# Patient Record
Sex: Male | Born: 1935 | Race: White | Hispanic: No | Smoking: Never smoker
Health system: Southern US, Community
[De-identification: ages and names within clinical notes are randomized; demographics above are authoritative.]

## PROBLEM LIST (undated history)

## (undated) DIAGNOSIS — D369 Benign neoplasm, unspecified site: Secondary | ICD-10-CM

## (undated) DIAGNOSIS — C61 Malignant neoplasm of prostate: Secondary | ICD-10-CM

## (undated) DIAGNOSIS — R7401 Elevation of levels of liver transaminase levels: Secondary | ICD-10-CM

## (undated) DIAGNOSIS — T7840XA Allergy, unspecified, initial encounter: Secondary | ICD-10-CM

## (undated) DIAGNOSIS — M1990 Unspecified osteoarthritis, unspecified site: Secondary | ICD-10-CM

## (undated) DIAGNOSIS — K222 Esophageal obstruction: Secondary | ICD-10-CM

## (undated) DIAGNOSIS — F5104 Psychophysiologic insomnia: Secondary | ICD-10-CM

## (undated) DIAGNOSIS — N529 Male erectile dysfunction, unspecified: Secondary | ICD-10-CM

## (undated) DIAGNOSIS — J310 Chronic rhinitis: Secondary | ICD-10-CM

## (undated) DIAGNOSIS — R740 Nonspecific elevation of levels of transaminase and lactic acid dehydrogenase [LDH]: Secondary | ICD-10-CM

## (undated) DIAGNOSIS — R011 Cardiac murmur, unspecified: Secondary | ICD-10-CM

## (undated) DIAGNOSIS — E785 Hyperlipidemia, unspecified: Secondary | ICD-10-CM

## (undated) DIAGNOSIS — N400 Enlarged prostate without lower urinary tract symptoms: Secondary | ICD-10-CM

## (undated) HISTORY — DX: Benign neoplasm, unspecified site: D36.9

## (undated) HISTORY — PX: HEMORRHOID SURGERY: SHX153

## (undated) HISTORY — DX: Cardiac murmur, unspecified: R01.1

## (undated) HISTORY — DX: Benign prostatic hyperplasia without lower urinary tract symptoms: N40.0

## (undated) HISTORY — PX: TONSILLECTOMY AND ADENOIDECTOMY: SUR1326

## (undated) HISTORY — PX: NASAL SINUS SURGERY: SHX719

## (undated) HISTORY — PX: POLYPECTOMY: SHX149

## (undated) HISTORY — DX: Chronic rhinitis: J31.0

## (undated) HISTORY — DX: Nonspecific elevation of levels of transaminase and lactic acid dehydrogenase (ldh): R74.0

## (undated) HISTORY — DX: Malignant neoplasm of prostate: C61

## (undated) HISTORY — DX: Psychophysiologic insomnia: F51.04

## (undated) HISTORY — PX: COLONOSCOPY: SHX174

## (undated) HISTORY — PX: DERMOID CYST  EXCISION: SHX1452

## (undated) HISTORY — DX: Elevation of levels of liver transaminase levels: R74.01

## (undated) HISTORY — DX: Esophageal obstruction: K22.2

## (undated) HISTORY — DX: Male erectile dysfunction, unspecified: N52.9

## (undated) HISTORY — DX: Hyperlipidemia, unspecified: E78.5

## (undated) HISTORY — PX: CATARACT EXTRACTION: SUR2

## (undated) HISTORY — DX: Allergy, unspecified, initial encounter: T78.40XA

---

## 1997-04-13 HISTORY — PX: RADIOACTIVE SEED IMPLANT: SHX5150

## 1999-04-16 ENCOUNTER — Other Ambulatory Visit: Admission: RE | Admit: 1999-04-16 | Discharge: 1999-04-16 | Payer: Self-pay | Admitting: Urology

## 1999-05-08 ENCOUNTER — Encounter: Admission: RE | Admit: 1999-05-08 | Discharge: 1999-08-06 | Payer: Self-pay | Admitting: Radiation Oncology

## 1999-07-08 ENCOUNTER — Encounter: Payer: Self-pay | Admitting: Urology

## 1999-07-08 ENCOUNTER — Ambulatory Visit (HOSPITAL_BASED_OUTPATIENT_CLINIC_OR_DEPARTMENT_OTHER): Admission: RE | Admit: 1999-07-08 | Discharge: 1999-07-09 | Payer: Self-pay | Admitting: Urology

## 1999-07-29 ENCOUNTER — Encounter: Payer: Self-pay | Admitting: Radiation Oncology

## 2001-02-28 ENCOUNTER — Encounter (INDEPENDENT_AMBULATORY_CARE_PROVIDER_SITE_OTHER): Payer: Self-pay | Admitting: *Deleted

## 2001-03-13 DIAGNOSIS — D369 Benign neoplasm, unspecified site: Secondary | ICD-10-CM

## 2001-03-13 HISTORY — DX: Benign neoplasm, unspecified site: D36.9

## 2001-03-25 ENCOUNTER — Encounter (INDEPENDENT_AMBULATORY_CARE_PROVIDER_SITE_OTHER): Payer: Self-pay | Admitting: *Deleted

## 2002-10-17 ENCOUNTER — Encounter (INDEPENDENT_AMBULATORY_CARE_PROVIDER_SITE_OTHER): Payer: Self-pay | Admitting: *Deleted

## 2006-01-19 ENCOUNTER — Ambulatory Visit: Payer: Self-pay | Admitting: Gastroenterology

## 2006-02-01 ENCOUNTER — Ambulatory Visit: Payer: Self-pay | Admitting: Gastroenterology

## 2006-02-01 ENCOUNTER — Encounter (INDEPENDENT_AMBULATORY_CARE_PROVIDER_SITE_OTHER): Payer: Self-pay | Admitting: *Deleted

## 2009-05-16 ENCOUNTER — Encounter: Payer: Self-pay | Admitting: Internal Medicine

## 2009-05-16 ENCOUNTER — Ambulatory Visit: Payer: Self-pay | Admitting: Internal Medicine

## 2009-05-16 DIAGNOSIS — R0989 Other specified symptoms and signs involving the circulatory and respiratory systems: Secondary | ICD-10-CM | POA: Insufficient documentation

## 2009-05-16 DIAGNOSIS — C61 Malignant neoplasm of prostate: Secondary | ICD-10-CM

## 2009-05-16 DIAGNOSIS — R0609 Other forms of dyspnea: Secondary | ICD-10-CM

## 2009-05-16 DIAGNOSIS — J310 Chronic rhinitis: Secondary | ICD-10-CM | POA: Insufficient documentation

## 2009-05-17 ENCOUNTER — Ambulatory Visit: Payer: Self-pay | Admitting: Internal Medicine

## 2009-05-17 LAB — CONVERTED CEMR LAB
Basophils Absolute: 0 10*3/uL (ref 0.0–0.1)
Eosinophils Absolute: 0.1 10*3/uL (ref 0.0–0.7)
HCT: 43 % (ref 39.0–52.0)
Hemoglobin: 14.3 g/dL (ref 13.0–17.0)
Lymphocytes Relative: 19.9 % (ref 12.0–46.0)
MCV: 95.2 fL (ref 78.0–100.0)
Monocytes Relative: 9.6 % (ref 3.0–12.0)
Neutrophils Relative %: 68.1 % (ref 43.0–77.0)
RDW: 12.1 % (ref 11.5–14.6)
Sed Rate: 10 mm/hr (ref 0–22)
TSH: 1.85 microintl units/mL (ref 0.35–5.50)
WBC: 5.5 10*3/uL (ref 4.5–10.5)

## 2009-05-20 ENCOUNTER — Ambulatory Visit: Payer: Self-pay | Admitting: Internal Medicine

## 2009-07-02 ENCOUNTER — Ambulatory Visit: Payer: Self-pay | Admitting: Internal Medicine

## 2009-07-19 ENCOUNTER — Telehealth (INDEPENDENT_AMBULATORY_CARE_PROVIDER_SITE_OTHER): Payer: Self-pay | Admitting: *Deleted

## 2010-04-08 ENCOUNTER — Encounter
Admission: RE | Admit: 2010-04-08 | Discharge: 2010-04-08 | Payer: Self-pay | Source: Home / Self Care | Attending: Internal Medicine | Admitting: Internal Medicine

## 2010-04-08 ENCOUNTER — Encounter (INDEPENDENT_AMBULATORY_CARE_PROVIDER_SITE_OTHER): Payer: Self-pay | Admitting: *Deleted

## 2010-04-17 ENCOUNTER — Encounter: Payer: Self-pay | Admitting: Gastroenterology

## 2010-04-21 ENCOUNTER — Ambulatory Visit
Admission: RE | Admit: 2010-04-21 | Discharge: 2010-04-21 | Payer: Self-pay | Source: Home / Self Care | Attending: Gastroenterology | Admitting: Gastroenterology

## 2010-04-21 ENCOUNTER — Encounter: Payer: Self-pay | Admitting: Gastroenterology

## 2010-04-21 ENCOUNTER — Other Ambulatory Visit: Payer: Self-pay | Admitting: Gastroenterology

## 2010-04-21 DIAGNOSIS — R7402 Elevation of levels of lactic acid dehydrogenase (LDH): Secondary | ICD-10-CM | POA: Insufficient documentation

## 2010-04-21 DIAGNOSIS — K219 Gastro-esophageal reflux disease without esophagitis: Secondary | ICD-10-CM | POA: Insufficient documentation

## 2010-04-21 DIAGNOSIS — Z860100 Personal history of colon polyps, unspecified: Secondary | ICD-10-CM | POA: Insufficient documentation

## 2010-04-21 DIAGNOSIS — R740 Nonspecific elevation of levels of transaminase and lactic acid dehydrogenase [LDH]: Secondary | ICD-10-CM

## 2010-04-21 LAB — HEPATIC FUNCTION PANEL
ALT: 225 U/L — ABNORMAL HIGH (ref 0–53)
AST: 189 U/L — ABNORMAL HIGH (ref 0–37)
Albumin: 3.5 g/dL (ref 3.5–5.2)
Alkaline Phosphatase: 53 U/L (ref 39–117)
Bilirubin, Direct: 0.3 mg/dL (ref 0.0–0.3)
Total Bilirubin: 1.4 mg/dL — ABNORMAL HIGH (ref 0.3–1.2)
Total Protein: 6.6 g/dL (ref 6.0–8.3)

## 2010-04-21 LAB — CONVERTED CEMR LAB
A-1 Antitrypsin, Ser: 169 mg/dL (ref 83–200)
Anti Nuclear Antibody(ANA): NEGATIVE

## 2010-04-21 LAB — PROTIME-INR
INR: 1.1 ratio — ABNORMAL HIGH (ref 0.8–1.0)
Prothrombin Time: 12.2 s (ref 10.2–12.4)

## 2010-04-29 ENCOUNTER — Other Ambulatory Visit: Payer: Self-pay | Admitting: Gastroenterology

## 2010-04-29 ENCOUNTER — Ambulatory Visit
Admission: RE | Admit: 2010-04-29 | Discharge: 2010-04-29 | Payer: Self-pay | Source: Home / Self Care | Attending: Gastroenterology | Admitting: Gastroenterology

## 2010-05-06 ENCOUNTER — Encounter: Payer: Self-pay | Admitting: Gastroenterology

## 2010-05-15 NOTE — Procedures (Signed)
Summary: Colonoscopy   Colonoscopy  Procedure date:  03/25/2001  Findings:      Results: Polyp.  Results: Colitis, Radiation.       Location:  [facility].    Procedures Next Due Date:    Colonoscopy: 03/2003 Patient Name: Stephen, Mcclain MRN: [id number] Procedure Procedures: Colonoscopy CPT: 95621.    with polypectomy. CPT: A3573898.  Personnel: Endoscopist: Ulyess Mort, MD.  Referred By: Aggie Cosier, MD.  Exam Location: Exam performed in Outpatient Clinic. Outpatient  Patient Consent: Procedure, Alternatives, Risks and Benefits discussed, consent obtained, from patient.  Indications Symptoms: Constipation Hematochezia.  Increased Risk Screening: For family history of colorectal neoplasia, in   History  Pre-Exam Physical: Performed Mar 25, 2001. Cardio-pulmonary exam, Rectal exam, HEENT exam , Abdominal exam, Extremity exam, Mental status exam WNL.  Exam Exam: Extent of exam reached: Cecum, extent intended: Cecum.  The cecum was identified by appendiceal orifice and IC valve. Patient position: left side to back. Colon retroflexion performed. Images taken. ASA Classification: II. Tolerance: good.  Monitoring: Pulse and BP monitoring, Oximetry used. Supplemental O2 given.  Colon Prep Prep results: fair.  Sedation Meds: Patient assessed and found to be appropriate for moderate (conscious) sedation. Versed 10 mg. Fentanyl  Findings - DIVERTICULOSIS: Splenic Flexure to Sigmoid Colon. Comments: MILD DIVERTICULOSIS.  POLYP: Transverse Colon, Maximum size: 6 mm. sessile polyp. Procedure:  hot biopsy, removed, retrieved, Polyp sent to pathology.  POLYP: Ascending Colon, Maximum size: 7 mm. sessile polyp. Procedure:  hot biopsy, removed, retrieved,  POLYP: Ascending Colon, Maximum size: 14 mm. sessile polyp. Procedure:  snare with cautery, The polyp was removed piece meal. removed, retrieved, sent to pathology. ICD9: Colon Polyps: 211.3.  POLYP:  Sigmoid Colon, Maximum size: 9 mm. sessile polyp. Procedure:  snare with cautery,  - ANATOMICAL DEFORMITY: Entire Colon. Comments: very eLONGATED COLON.  - MUCOSAL ABNORMALITY: Sigmoid Colon to Rectum. radiation proctitis present, Friability: Exists. ICD9: Colitis, Radiation: 558.2. Comments: MILD .   Assessment Abnormal examination, see findings above.  Diagnoses: 211.3: Colon Polyps.  558.2: Colitis, Radiation.   Events  Unplanned Interventions: No intervention was required.  Unplanned Events: There were no complications. Plans Medication Plan: Continue current medications. Fiber supplements:  5-ASA: Mesalamine Suppository   Patient Education: Patient given standard instructions for: Polyps. Colitis. Diverticulosis. Hemorrhoids. Constipation.Yearly hemoccult testing recommended. Patient instructed to get routine colonoscopy every 2 years.  Disposition: After procedure patient sent to recovery. After recovery patient sent home.  This report was created from the original endoscopy report, which was reviewed and signed by the above listed endoscopist.    cc: Aggie Cosier, MD

## 2010-05-15 NOTE — Procedures (Signed)
Summary: Colonoscopy   Colonoscopy  Procedure date:  10/17/2002  Findings:      Results: Polyp.  Location:  [facility].    Procedures Next Due Date:    Colonoscopy: 10/2006  Colonoscopy  Procedure date:  10/17/2002  Findings:      Results: Polyp.  Location:  [facility].    Procedures Next Due Date:    Colonoscopy: 10/2006 Patient Name: Stephen Mcclain, Stephen Mcclain MRN: [id number] Procedure Procedures: Colonoscopy CPT: 14782.  Personnel: Endoscopist: Ulyess Mort, MD.  Exam Location: Exam performed in Outpatient Clinic. Outpatient  Patient Consent: Procedure, Alternatives, Risks and Benefits discussed, consent obtained, from patient. Consent was obtained by the RN.  Indications  Surveillance of: Adenomatous Polyp(s).  History  Pre-Exam Physical: Performed Oct 17, 2002. Cardio-pulmonary exam, Rectal exam, HEENT exam , Abdominal exam, Extremity exam, Mental status exam WNL.  Exam Exam: Extent of exam reached: Cecum, extent intended: Cecum.  The cecum was identified by appendiceal orifice and IC valve. Colon retroflexion performed. Images were not taken. ASA Classification: II. Tolerance: good.  Monitoring: Pulse and BP monitoring, Oximetry used. Supplemental O2 given.  Colon Prep Prep results: good.  Sedation Meds: Patient assessed and found to be appropriate for moderate (conscious) sedation. Fentanyl given IV. Versed given IV.  Findings POLYP: Hepatic Flexure, Maximum size: 2 mm. sessile polyp. Procedure:  hot biopsy, removed, not retrieved,  POLYP: Ascending Colon, Maximum size: 6 mm. sessile polyp. Procedure:  hot biopsy, removed, retrieved, Polyp sent to pathology. ICD9: Colon Polyps: 211.3.  POLYP: Descending Colon, Maximum size: 3 mm. sessile polyp. Procedure:  hot biopsy, removed, not retrieved,  - NOT SEEN ON EXAM: Cecum to Rectum. AVM's, Colitis, Tumors, Melanosis, Crohn's, Diverticulosis, Comments: very elongated colon.    Assessment Abnormal examination, see findings above.  Diagnoses: 211.3: Colon Polyps.   Events  Unplanned Interventions: No intervention was required.  Unplanned Events: There were no complications. Plans Medication Plan: Await pathology. Continue current medications.  Patient Education: Patient given standard instructions for: Polyps. Yearly hemoccult testing recommended. Patient instructed to get routine colonoscopy every 4 years.  Disposition: After procedure patient sent to recovery. After recovery patient sent home.  This report was created from the original endoscopy report, which was reviewed and signed by the above listed endoscopist.    cc: Aggie Cosier, MD

## 2010-05-15 NOTE — Progress Notes (Signed)
Summary: [facility], [facility]   Imported By: Sherian Rein 04/22/2010 07:34:07  _____________________________________________________________________  External Attachment:    Type:   Image     Comment:   External Document

## 2010-05-15 NOTE — Procedures (Signed)
Summary: Colonoscopy and biopsy   Colonoscopy  Procedure date:  02/01/2006  Findings:      Results: Polyp.  Results: Hemorrhoids.     Location:  [facility].    Procedures Next Due Date:    Colonoscopy: 02/2011 Patient Name: Stephen Mcclain, Stephen Mcclain MRN: [id number] Procedure Procedures: Colonoscopy CPT: 91478.  Personnel: Endoscopist: Ulyess Mort, MD.  Exam Location: Exam performed in Outpatient Clinic. Outpatient  Patient Consent: Procedure, Alternatives, Risks and Benefits discussed, consent obtained, from patient. Consent was obtained by the RN.  Indications  Surveillance of: Adenomatous Polyp(s).  History  Current Medications: Patient is not currently taking Coumadin.  Pre-Exam Physical: Performed Oct 17, 2002. Entire physical exam was normal. Cardio- pulmonary exam, Rectal exam, HEENT exam , Abdominal exam, Extremity exam, Mental status exam WNL.  Comments: Pt. history reviewed/updated, physical exam performed prior to initiation of sedation? Exam Exam: Extent of exam reached: Cecum, extent intended: Cecum.  The cecum was identified by appendiceal orifice and IC valve. Colon retroflexion performed. Images taken. ASA Classification: II. Tolerance: good.  Monitoring: Pulse and BP monitoring, Oximetry used. Supplemental O2 given.  Colon Prep Prep results: good.  Sedation Meds: Patient assessed and found to be appropriate for moderate (conscious) sedation. Fentanyl 100 mcg. given IV. Versed 10 mg. given IV.  Comments: i=14 w=20 Findings POLYP: Transverse Colon, Maximum size: 3 mm. sessile polyp. Procedure:  hot biopsy, removed, not retrieved, ICD9: Colon Polyps: 211.3.  POLYP: Ascending Colon, Maximum size: 2 mm. sessile polyp. Procedure:  hot biopsy, removed, not retrieved,  POLYP: Transverse Colon, Maximum size: 2 mm. sessile polyp. Procedure:  hot biopsy, removed, not retrieved,  POLYP: Sigmoid Colon, Maximum size: 2 mm. sessile polyp.  Procedure:  hot biopsy, removed, not retrieved,  POLYP: Cecum, Maximum size: 3 mm. sessile polyp. Procedure:  hot biopsy, removed, retrieved, Polyp sent to pathology.  POLYP: Sigmoid Colon, Maximum size: 2 mm. sessile polyp. Procedure:  hot biopsy, removed, not retrieved,  - HEMORRHOIDS: Internal. Size: Grade I. ICD9: Hemorrhoids, Internal: 455.0.    Comments: elongated colon probable cause for constipation Assessment Abnormal examination, see findings above.  Diagnoses: 211.3: Colon Polyps.  455.0: Hemorrhoids, Internal.   Events  Unplanned Interventions: No intervention was required.  Unplanned Events: There were no complications. Plans Patient Education: Patient given standard instructions for: Polyps. Hemorrhoids. Patient instructed to get routine colonoscopy every 5 years.  Disposition: After procedure patient sent to recovery. After recovery patient sent home.  This report was created from the original endoscopy report, which was reviewed and signed by the above listed endoscopist.    cc: Aggie Cosier, MD         [facility] Surgical Pathology - STATUS: Final             By: Darrick Penna MD , Margaretha Glassing      Perform Date: 22Oct07 00:01  Ordered By: Dorene Ar,        Ordered Date: 23Oct07 14:46  Facility: [facility]                               Department: CPATH  Service Report Text  [facility]   [address] [address]   [place], [place] [address]   Telephone [phone] or [phone] Fax [phone]    REPORT OF SURGICAL PATHOLOGY    Case #: [id number]   Patient Name: Stephen Mcclain   Office Chart Number: [phone]  MRN: [id number]   Pathologist: Elisha Ponder, M.D.   DOB/Age Mar 21, 1931 (Age: 77) Gender: M   Date Taken: 02/01/2006   Date Received: 02/02/2006    FINAL DIAGNOSIS    ***MICROSCOPIC EXAMINATION AND DIAGNOSIS***    COLON, POLYP(S): HYPERPLASTIC POLYP(S). NO ADENOMATOUS CHANGE   OR MALIGNANCY IDENTIFIED.    gdt    Date Reported: 02/03/2006 Elisha Ponder, M.D.   *** Electronically Signed Out By VJF ***    Clinical information   4 mm polyp. hx of polyps. R/O adenoma. (mw)    specimen(s) obtained   Colon, polyp(s), cecum    Gross Description   Received in formalin is a tan, soft tissue fragment that is   submitted in toto. Size: 0.2 cm/1 block (TB:jy) 02/02/06    jy/

## 2010-05-15 NOTE — Miscellaneous (Signed)
Summary: Omeprazole Rx  Clinical Lists Changes  Medications: Added new medication of OMEPRAZOLE 20 MG  CPDR (OMEPRAZOLE) 1 each day 30 minutes before meal every morning. - Signed Rx of OMEPRAZOLE 20 MG  CPDR (OMEPRAZOLE) 1 each day 30 minutes before meal every morning.;  #30 x 11;  Signed;  Entered by: Durwin Glaze RN;  Authorized by: Meryl Dare MD [facility];  Method used: Electronically to [facility] #[id number]*, [address] [place], [place], [place]  [address], Ph: [phone], Fax: [phone]    Prescriptions: OMEPRAZOLE 20 MG  CPDR (OMEPRAZOLE) 1 each day 30 minutes before meal every morning.  #30 x 11   Entered by:   Durwin Glaze RN   Authorized by:   Meryl Dare MD [facility]   Signed by:   Durwin Glaze RN on 04/29/2010   Method used:   Electronically to        [facility] [phone]* (retail)       [phone]       [place], [place]  [address]       Ph: [phone]       Fax: [phone]   RxID:   [id number]

## 2010-05-15 NOTE — Assessment & Plan Note (Signed)
Summary: NP follow up - med calendar   Copy to:  Dr. Maryjean Morn Primary Provider/Referring Provider:  Dr. Thea Silversmith  CC:  new med calendar - pt brought all meds with him today.  History of Present Illness: 32 yowm never smoker with no chronic resp problems  May 16, 2009 cc new onset cough 02/12/09 assoc with nasal stuffiness  and facial  pain seen UC 11/9 some better with uri rx  then worse again after christmas then back uc on 12/30 zpak, decongestants thinks maybe some better but still can't sleep with sensation of nasal obstruction and restlessness.   May 20, 2009--Last visit. CXR w/ no acute change, CT sinus w/ acute/chronic dz w/ referral to ENT. Presents for new med calendar - pt brought all meds with him today. Pt was seen by Dr. Jearld Fenton, ENT rx an abx- notes not available at today's visit. He is on multiple herbs/vitamins , we sorted through more than 15 vitamins and narrowed down to the essential vitamins and got rid of duplicates. We went over scheduled vs as needed meds. Denies chest pain,  orthopnea, hemoptysis, fever, n/v/d, edema, headache.   Preventive Screening-Counseling & Management  Alcohol-Tobacco     Smoking Status: never  Medications Prior to Update: 1)  Lorazepam 1 Mg Tabs (Lorazepam) .Marland Kitchen.. 1 At Bedtime 2)  Miralax  Pack (Polyethylene Glycol 3350) .... As Needed 3)  Senokot 8.6 Mg Tabs (Sennosides) .... As Needed 4)  Multivitamins  Tabs (Multiple Vitamin) .Marland Kitchen.. 1 Once Daily 5)  Mucinex D 60-600 Mg Xr12h-Tab (Pseudoephedrine-Guaifenesin) .Marland Kitchen.. 1 Every 12 Hours As Needed  Allergies (verified): No Known Drug Allergies  Past History:  Family History: Last updated: 05/16/2009 Heart dz- Father   Social History: Last updated: 05/16/2009 Divorced Children Retired Never smoker ETOH Occ  Risk Factors: Smoking Status: never (05/20/2009)  Past Medical History: Hx of PROSTATE CANCER (ICD-185) Chronic rhinitis     - Sinus Ct  May 17 2009 > pos  acute and chronic sinusitis--referred to Dr. Jearld Fenton.   Social History: Smoking Status:  never  Review of Systems      See HPI  Vital Signs:  Patient profile:   77 year old male Height:      73 inches Weight:      207.25 pounds BMI:     27.44 O2 Sat:      98 % on Room air Temp:     97.9 degrees F oral Pulse rate:   73 / minute BP sitting:   130 / 84  (left arm) Cuff size:   regular  Vitals Entered By: Boone Master CNA (May 20, 2009 3:07 PM)  O2 Flow:  Room air CC: new med calendar - pt brought all meds with him today Is Patient Diabetic? No Comments Medications reviewed with patient Daytime contact number verified with patient. Boone Master CNA  May 20, 2009 3:07 PM    Physical Exam  Additional Exam:  amb wm  nad wt 208 May 16 2009  HEENT: nl dentition  and orophanx.  Nl external ear canals without cough reflex NECK :  without JVD/Nodes/TM/ nl carotid upstrokes bilaterally LUNGS: no acc muscle use, clear to A and P bilaterally without cough on insp or exp maneuvers CV:  RRR  no s3 or murmur or increase in P2, no edema  ABD:  soft and nontender with nl excursion in the supine position. No bruits or organomegaly, bowel sounds nl MS:  warm without  deformities, calf tenderness, cyanosis or clubbing SKIN: warm and dry without lesions   NEURO:  alert, no deficits, very anxious     Impression & Recommendations:  Problem # 1:  CHRONIC RHINITIS (ICD-472.0) CT sinus w/ acute and chronic sinusitis, seen by Dr. Jearld Fenton today.  follow up Dr. Sherene Sires in 6 weeks  Meds reviewed with pt education and computerized med calendar completed/adjusted.     Orders: Est. Patient Level III ([address])  Complete Medication List: 1)  Lorazepam 1 Mg Tabs (Lorazepam) .Marland Kitchen.. 1 at bedtime 2)  Miralax Pack (Polyethylene glycol 3350) .... As needed 3)  Senokot 8.6 Mg Tabs (Sennosides) .... As needed 4)  Multivitamins Tabs (Multiple vitamin) .Marland Kitchen.. 1 once daily 5)  Mucinex D 60-600 Mg  Xr12h-tab (Pseudoephedrine-guaifenesin) .Marland Kitchen.. 1 every 12 hours as needed  Patient Instructions: 1)  Follow med calendar closely and bring list back to each visit.  2)  follow up Dr. Jearld Fenton as recommended 3)  finish antibiiotics as recommended.  4)  Please contact office for sooner follow up if symptoms do not improve or worsen  5)  follow up Dr. Sherene Sires in 6 weeks.    Immunization History:  Influenza Immunization History:    Influenza:  historical (02/11/2009)  Appended Document: med calendar update    Clinical Lists Changes  Medications: Added new medication of VISION VITAMINS  TABS (MULTIPLE VITAMINS-MINERALS) Take 1 tablet by mouth once a day Changed medication from MIRALAX  PACK (POLYETHYLENE GLYCOL 3350) as needed to MIRALAX  POWD (POLYETHYLENE GLYCOL 3350) 1 capful by mouth at bedtime Added new medication of * ULTIMATE BLADDER TABS Take 1 tablet by mouth once a day Added new medication of * VITAMIN D3 5,000I.U. Take 1 capsule by mouth once a day Changed medication from MUCINEX D 60-600 MG XR12H-TAB (PSEUDOEPHEDRINE-GUAIFENESIN) 1 every 12 hours as needed to MUCINEX 600 MG XR12H-TAB (GUAIFENESIN) per bottle Added new medication of * BRONCHORIL per bottle Added new medication of SALINE NASAL SPRAY 0.65 % SOLN (SALINE) 2 puffs each nostril four times daily Added new medication of HYDROMET 5-1.5 MG/5ML SYRP (HYDROCODONE-HOMATROPINE) 1-2 teaspoons every 4-6 hours as needed Added new medication of ADVIL 200 MG TABS (IBUPROFEN) per bottle Changed medication from SENOKOT 8.6 MG TABS (SENNOSIDES) as needed to STOOL SOFTENER 100 MG CAPS (DOCUSATE SODIUM) per bottle

## 2010-05-15 NOTE — Assessment & Plan Note (Signed)
Summary: Pulmonary/ ext summary f/u ov   Copy to:  Dr. Maryjean Morn Primary Provider/Referring Provider:  Dr. Thea Silversmith  CC:  6 wk followup.  Pt states that he is doing much better.  He denies any complaints today.Marland Kitchen  History of Present Illness: 66 yowm never smoker with no chronic resp problems  May 16, 2009 cc new onset cough 02/12/09 assoc with nasal stuffiness  and facial  pain seen UC 11/9 some better with uri rx  then worse again after christmas then back uc on 12/30 zpak, decongestants thinks maybe some better but still can't sleep with sensation of nasal obstruction and restlessness.   May 20, 2009--Last visit. CXR w/ no acute change, CT sinus w/ acute/chronic dz w/ referral to ENT. Presents for new med calendar - pt brought all meds with him today. Pt was seen by Dr. Jearld Fenton, ENT rx an abx- notes not available at today's visit. He is on multiple herbs/vitamins , we sorted through more than 15 vitamins and narrowed down to the essential vitamins and got rid of duplicates. We went over scheduled vs as needed meds.   July 02, 2009 6 wk followup.  Pt states that he is doing much better.  He denies any complaints today. Still under rx by Jearld Fenton.  Pt denies any significant sore throat, dysphagia, itching, sneezing,   fever, chills, sweats, unintended wt loss, pleuritic or exertional cp, hempoptysis, change in activity tolerance  orthopnea pnd or leg swelling.  no cough or sob  Current Medications (verified): 1)  Multivitamins  Tabs (Multiple Vitamin) .Marland Kitchen.. 1 Once Daily 2)  Vision Vitamins  Tabs (Multiple Vitamins-Minerals) .... Take 1 Tablet By Mouth Once A Day 3)  Miralax  Powd (Polyethylene Glycol 3350) .Marland Kitchen.. 1 Capful By Mouth At Bedtime As Needed 4)  Ultimate Bladder Tabs .... Take 1 Tablet By Mouth Once A Day 5)  Vitamin D3 5,000i.u. .... Take 1 Capsule By Mouth Once A Day 6)  Nexium 40 Mg Cpdr (Esomeprazole Magnesium) .Marland Kitchen.. 1 Two Times A Day 7)  Lorazepam 1 Mg Tabs (Lorazepam)  .Marland Kitchen.. 1 At Bedtime 8)  Mucinex 600 Mg Xr12h-Tab (Guaifenesin) .... Per Bottle 9)  Bronchoril .... Per Bottle 10)  Saline Nasal Spray 0.65 % Soln (Saline) .... 2 Puffs Each Nostril Four Times Daily 11)  Hydromet 5-1.5 Mg/66ml Syrp (Hydrocodone-Homatropine) .Marland Kitchen.. 1-2 Teaspoons Every 4-6 Hours As Needed 12)  Advil 200 Mg Tabs (Ibuprofen) .... Per Bottle 13)  Stool Softener 100 Mg Caps (Docusate Sodium) .... Per Bottle 14)  Cvs Sleep Aid 50 Mg Caps (Diphenhydramine Hcl (Sleep)) .Marland Kitchen.. 1 At Bedtime As Needed 15)  Flora Source .Marland Kitchen.. 1 Once Daily 16)  Bioactive Q 100 Mg .Marland Kitchen.. 1 Once Daily 17)  Integrative Digestive Formula .Marland Kitchen.. 1 Once Daily  Allergies (verified): No Known Drug Allergies  Past History:  Past Medical History: Hx of PROSTATE CANCER (ICD-185) Chronic rhinitis     - Sinus Ct  May 17 2009 > pos acute and chronic sinusitis...............Marland Kitchen Dr. Jearld Fenton  Vital Signs:  Patient profile:   77 year old male Weight:      207 pounds O2 Sat:      96 % on Room air Temp:     97.5 degrees F oral Pulse rate:   65 / minute BP sitting:   116 / 64  (left arm)  Vitals Entered By: Vernie Murders (July 02, 2009 11:08 AM)  O2 Flow:  Room air  Physical Exam  Additional Exam:  amb  wm  nad wt 208 May 16 2009 > 207 July 02, 2009  HEENT: nl dentition  and orophanx/ mod bilateral nonspecifi turbinate edema Nl external ear canals without cough reflex NECK :  without JVD/Nodes/TM/ nl carotid upstrokes bilaterally LUNGS: no acc muscle use, clear to A and P bilaterally without cough on insp or exp maneuvers CV:  RRR  no s3 or murmur or increase in P2, no edema  ABD:  soft and nontender with nl excursion in the supine position. No bruits or organomegaly, bowel sounds nl MS:  warm without deformities, calf tenderness, cyanosis or clubbing       Impression & Recommendations:  Problem # 1:  CHRONIC RHINITIS (ICD-472.0) Resp problems are much better and does not need any resp meds at this  point    DDX of  difficult airways managment all start with A and  include Adherence, Ace Inhibitors, Acid Reflux, Active Sinus Disease, Alpha 1 Antitripsin deficiency, Anxiety masquerading as Airways dz,  ABPA,  allergy(esp in young), Aspiration (esp in elderly), Adverse effects of DPI,  Active smokers, plus one B  = Beta blocker use..    I had an extended summary discussion with the patient today lasting 15 to 20 minutes of a 25 minute visit on the following issues:   Active sinus dz would appear to be the greatest concern here and unless lower resp symptoms flare after he is cleared by Dr Jearld Fenton there's no need for regular pulmonary f/u  Acid refux also an issue.  Spent extra time going over diet and timing for nexium   Each maintenance medication was reviewed in detail including most importantly the difference between maintenance and as needed and under what circumstances the prns are to be used. This was done in the context of a medication calendar review which provided the patient with a user-friendly unambiguous mechanism for medication administration and reconciliation and provides an action plan for all active problems. It is critical that this be shown to every doctor  for modification during the office visit if necessary so the patient can use it as a working document.      Medications Added to Medication List This Visit: 1)  Miralax Powd (Polyethylene glycol 3350) .Marland Kitchen.. 1 capful by mouth at bedtime as needed 2)  Nexium 40 Mg Cpdr (Esomeprazole magnesium) .Marland Kitchen.. 1 two times a day 3)  Cvs Sleep Aid 50 Mg Caps (Diphenhydramine hcl (sleep)) .Marland Kitchen.. 1 at bedtime as needed 4)  Flora Source  .Marland Kitchen.. 1 once daily 5)  Bioactive Q 100 Mg  .Marland Kitchen.. 1 once daily 6)  Integrative Digestive Formula  .Marland Kitchen.. 1 once daily  Other Orders: Est. Patient Level IV ([address])  Patient Instructions: 1)  See calendar for specific medication instructions and bring it back for each and every office visit for every  healthcare provider you see.  Without it,  you may not receive the best quality medical care that we feel you deserve.  2)  If Dr Jearld Fenton releases you and you're not happy with your cough you need to come back here 3)  GERD (REFLUX)  is a common cause of respiratory symptoms. It commonly presents without heartburn and can be treated with medication, but also with lifestyle changes including avoidance of late meals, excessive alcohol, smoking cessation, and avoid fatty foods, chocolate, peppermint, colas, red wine, and acidic juices such as orange juice. NO MINT OR MENTHOL PRODUCTS SO NO COUGH DROPS  4)  USE SUGARLESS CANDY INSTEAD (jolley ranchers)  5)  NO OIL BASED VITAMINS  6)  Nexium 40 Take one 30-60 min before first and last meals of the day

## 2010-05-15 NOTE — Assessment & Plan Note (Signed)
Summary: Pulmonary new pt eval/ rhinitis and anxiety   Visit Type:  Initial Consult Copy to:  Dr. Maryjean Morn Primary Provider/Referring Provider:  Dr. Thea Silversmith  CC:  Abnormal Chest Xray.  Marland Kitchen  History of Present Illness: 27 yowm never smoker with no chronic resp problems  May 16, 2009 cc new onset cough 02/12/09 assoc with nasal stuffiness  and facial  pain seen UC 11/9 some better with uri rx  then worse again after christmas then back uc on 12/30 zpak, decongestants thinks maybe some better but still can't sleep with sensation of nasal obstruction and restlessness.  Pt denies any significant sore throat, dysphagia, itching, sneezing, purulent or excess nasal secretions,  fever, chills, sweats, unintended wt loss, pleuritic or exertional cp, hempoptysis, change in activity tolerance  orthopnea pnd or leg swelling   Current Medications (verified): 1)  Lorazepam 1 Mg Tabs (Lorazepam) .Marland Kitchen.. 1 At Bedtime 2)  Miralax  Pack (Polyethylene Glycol 3350) .... As Needed 3)  Senokot 8.6 Mg Tabs (Sennosides) .... As Needed 4)  Multivitamins  Tabs (Multiple Vitamin) .Marland Kitchen.. 1 Once Daily 5)  Mucinex D 60-600 Mg Xr12h-Tab (Pseudoephedrine-Guaifenesin) .Marland Kitchen.. 1 Every 12 Hours As Needed  Allergies (verified): No Known Drug Allergies  Past History:  Past Medical History: Hx of PROSTATE CANCER (ICD-185) Chronic rhinitis     - Sinus Ct  May 17 2009 > pos acute and chronic sinusitis  Family History: Heart dz- Father   Social History: Divorced Children Retired Never smoker ETOH Occ  Review of Systems       The patient complains of shortness of breath with activity, chest pain, and nasal congestion/difficulty breathing through nose.  The patient denies shortness of breath at rest, productive cough, non-productive cough, coughing up blood, irregular heartbeats, acid heartburn, indigestion, loss of appetite, weight change, abdominal pain, difficulty swallowing, sore throat, tooth/dental  problems, headaches, sneezing, itching, ear ache, anxiety, depression, hand/feet swelling, joint stiffness or pain, rash, change in color of mucus, and fever.    Vital Signs:  Patient profile:   77 year old male Height:      73 inches Weight:      208 pounds BMI:     27.54 O2 Sat:      96 % on Room air Temp:     97.9 degrees F oral Pulse rate:   70 / minute BP sitting:   120 / 74  (left arm)  Vitals Entered By: Vernie Murders (May 16, 2009 1:44 PM)  O2 Flow:  Room air  Physical Exam  Additional Exam:  amb wm unusual affect nad wt 208 May 16 2009  HEENT: nl dentition  and orophanx.  Nl external ear canals without cough reflex NECK :  without JVD/Nodes/TM/ nl carotid upstrokes bilaterally LUNGS: no acc muscle use, clear to A and P bilaterally without cough on insp or exp maneuvers CV:  RRR  no s3 or murmur or increase in P2, no edema  ABD:  soft and nontender with nl excursion in the supine position. No bruits or organomegaly, bowel sounds nl MS:  warm without deformities, calf tenderness, cyanosis or clubbing SKIN: warm and dry without lesions   NEURO:  alert, no deficits, very anxious     White Cell Count          5.5 K/uL                    4.5-10.5   Red Cell Count  4.51 Mil/uL                 4.22-5.81   Hemoglobin                14.3 g/dL                   66.4-40.3   Hematocrit                43.0 %                      39.0-52.0   MCV                       95.2 fl                     78.0-100.0   MCHC                      33.2 g/dL                   47.4-25.9   RDW                       12.1 %                      11.5-14.6   Platelet Count            296.0 K/uL                  150.0-400.0   Neutrophil %              68.1 %                      43.0-77.0   Lymphocyte %              19.9 %                      12.0-46.0   Monocyte %                9.6 %                       3.0-12.0   Eosinophils%              1.8 %                       0.0-5.0    Basophils %               0.6 %                       0.0-3.0   Neutrophill Absolute      3.8 K/uL                    1.4-7.7   Lymphocyte Absolute       1.1 K/uL                    0.7-4.0   Monocyte Absolute         0.5 K/uL                    0.1-1.0  Eosinophils, Absolute  0.1 K/uL                    0.0-0.7   Basophils Absolute        0.0 K/uL                    0.0-0.1  Tests: (2) TSH (TSH)   FastTSH                   1.85 uIU/mL                 0.35-5.50  Tests: (3) B-Type Natiuretic Peptide (BNPR)  B-Type Natriuetic Peptide                             21.0 pg/mL                  0.0-100.0  Tests: (4) Sed Rate (ESR)   Sed Rate                  10 mm/hr                    0-22  CXR  Procedure date:  05/16/2009  Findings:      Comparison: 07/29/1999 study report   Findings: The cardiac silhouette is minimally enlarged. There is slight ectasia of thoracic aorta.  No pulmonary edema, pneumonia, or pleural effusion is evident.  There is an old healed fracture of the left eighth rib with minimal adjacent pleural thickening. There is minimal degenerative spondylosis.   IMPRESSION: The cardiac silhouette is minimally enlarged. No acute cardiopulmonary process is identified.  Chronic abnormalities are detailed above.    CT of Sinus  Procedure date:  05/17/2009  Findings:        Findings: There is mucosal thickening in the right maxillary sinus which also contains a small air-fluid level.  There is mucosal thickening throughout the right ethmoid and right frontal sinus. There is mild mucosal thickening in the left frontal sinus.   The nasal septum is deviated the right.  No acute bony abnormality. Negative for orbital edema or mass.  Bilateral cataract surgery.   IMPRESSION: Acute and chronic sinusitis, primarily on the right side.  Impression & Recommendations:  Problem # 1:  CHRONIC RHINITIS (ICD-472.0) Most of his symptoms appear to  be upper resp and are disproportionate to objective findings calling into question accuracy of the hx and the degree to which chronic lorazepam use/dependency is contributing to his sleep deprivation and abn affect today in the clinic.   ADD:  CT sinus   Findings: There is mucosal thickening in the right maxillary sinus which also contains a small air-fluid level.  There is mucosal thickening throughout the right ethmoid and right frontal sinus. There is mild mucosal thickening in the left frontal sinus.   The nasal septum is deviated the right.  No acute bony abnormality. Negative for orbital edema or mass.  Bilateral cataract surgery.   IMPRESSION: Acute and chronic sinusitis, primarily on the right side.  This is a longterm issue and is best addressed by ent but we can see for purposes of medication reconciliation if he is willing.   To keep things simple, I have asked the patient to first separate medicines that are perceived as maintenance, that is to be taken daily "no matter what", from those medicines that are taken on only on an as-needed basis and I have  given the patient examples of both, and then return to see our NP to generate a  detailed  medication calendar which should be followed until the next physician sees the patient and updates it.     Medications Added to Medication List This Visit: 1)  Lorazepam 1 Mg Tabs (Lorazepam) .Marland Kitchen.. 1 at bedtime 2)  Miralax Pack (Polyethylene glycol 3350) .... As needed 3)  Senokot 8.6 Mg Tabs (Sennosides) .... As needed 4)  Multivitamins Tabs (Multiple vitamin) .Marland Kitchen.. 1 once daily 5)  Mucinex D 60-600 Mg Xr12h-tab (Pseudoephedrine-guaifenesin) .Marland Kitchen.. 1 every 12 hours as needed  Other Orders: T-2 View CXR (71020TC) New Patient Level IV (13086) Radiology Referral (Radiology) TLB-CBC Platelet - w/Differential (85025-CBCD) TLB-TSH (Thyroid Stimulating Hormone) (84443-TSH) TLB-BNP (B-Natriuretic Peptide) (83880-BNPR) TLB-Sedimentation Rate (ESR)  (85652-ESR)  Patient Instructions: 1)  See Patient Care Coordinator before leaving for scheduling a sinus ct first of new week and we will add a chest ct if your cxr today is abnormal

## 2010-05-15 NOTE — Letter (Signed)
Summary: EGD Instructions  [facility]  [address] [place], [place] [address]   Phone: [phone]  Fax: [phone]       [facility]    October 26, 1930    MRN: [id number]       Procedure Day /Date: Tuesday January 17th, 2012     Arrival Time:  9:30am     Procedure Time: 10:30am     Location of Procedure:                    _x  _ [facility] (4th Floor)    PREPARATION FOR ENDOSCOPY   On 04/29/10 THE DAY OF THE PROCEDURE:  1.   No solid foods, milk or milk products are allowed after midnight the night before your procedure.  2.   Do not drink anything colored red or purple.  Avoid juices with pulp.  No orange juice.  3.  You may drink clear liquids until 8:30am, which is 2 hours before your procedure.                                                                                                CLEAR LIQUIDS INCLUDE: Water Jello Ice Popsicles Tea (sugar ok, no milk/cream) Powdered fruit flavored drinks Coffee (sugar ok, no milk/cream) Gatorade Juice: apple, white grape, white cranberry  Lemonade Clear bullion, consomm, broth Carbonated beverages (any kind) Strained chicken noodle soup Hard Candy   MEDICATION INSTRUCTIONS  Unless otherwise instructed, you should take regular prescription medications with a small sip of water as early as possible the morning of your procedure.        OTHER INSTRUCTIONS  You will need a responsible adult at least 75 years of age to accompany you and drive you home.   This person must remain in the waiting room during your procedure.  Wear loose fitting clothing that is easily removed.  Leave jewelry and other valuables at home.  However, you may wish to bring a book to read or an iPod/MP3 player to listen to music as you wait for your procedure to start.  Remove all body piercing jewelry and leave at home.  Total time from sign-in until discharge is approximately 2-3 hours.  You should go home  directly after your procedure and rest.  You can resume normal activities the day after your procedure.  The day of your procedure you should not:   Drive   Make legal decisions   Operate machinery   Drink alcohol   Return to work  You will receive specific instructions about eating, activities and medications before you leave.    The above instructions have been reviewed and explained to me by   _______________________    I fully understand and can verbalize these instructions _____________________________ Date _________

## 2010-05-15 NOTE — Progress Notes (Signed)
  Pt.Signed ROI,and picked up copy of CT [facility]  July 19, 2009 2:45 PM

## 2010-05-15 NOTE — Procedures (Addendum)
Summary: Upper Endoscopy  Patient: [facility] Note: All result statuses are Final unless otherwise noted.  Tests: (1) Upper Endoscopy (EGD)   EGD Upper Endoscopy       DONE     [facility]     [address]     [place], [place]  [address]           ENDOSCOPY PROCEDURE REPORT     PATIENT:  [name], [name]  MR#:  [id number]     BIRTHDATE:  02-21-1931, 79 yrs. old  GENDER:  male     ENDOSCOPIST:  Judie Petit T. Russella Dar, MD, [facility]     Referred by:  Thayer Headings, M.D.     PROCEDURE DATE:  04/29/2010     PROCEDURE:  EGD with biopsy, 43239     ASA CLASS:  Class II     INDICATIONS:  GERD and elevated tTG     MEDICATIONS:  Fentanyl 50 mcg IV, Versed 5 mg IV     TOPICAL ANESTHETIC:  Exactacain Spray     DESCRIPTION OF PROCEDURE:   After the risks benefits and     alternatives of the procedure were thoroughly explained, informed     consent was obtained.  [facility] GIF-H180 [id number] endoscope was     introduced through the mouth and advanced to the second portion of     the duodenum, without limitations.  The instrument was slowly     withdrawn as the mucosa was fully examined.     <<PROCEDUREIMAGES>>     A stricture was found at the gastroesophageal junction with an     irregular z-line. It was benign appearing and circumferential.     Multiple biopsies were obtained and sent to pathology.  Duodenitis     was found in the bulb of the duodenum. It was mild, erosive and     erythematous. Random biopsies were obtained and sent to pathology.     The duodenal bulb was normal in appearance, as was the postbulbar     duodenum. in the descending duodenum. Random biopsies were     obtained and sent to pathology.  Otherwise normal esophagus.  The     stomach was entered and closely examined. The pylorus, antrum,     angularis, and lesser curvature were well visualized, including a     retroflexed view of the cardia and fundus. The stomach wall was     normally distensable. The scope passed easily  through the pylorus     into the duodenum.  Retroflexed views revealed a hiatal hernia,     small. The scope was then withdrawn from the patient and the     procedure completed.     COMPLICATIONS:  None           ENDOSCOPIC IMPRESSION:     1) Stricture at the gastroesophageal junction     2) Duodenitis in the bulb of duodenum     3) Small hiatal hernia           RECOMMENDATIONS:     1) Anti-reflux regimen     2) Await pathology results     3) PPI qam: omeprazole 20mg  po qam, #30, 11 refills           Altheia Shafran T. Russella Dar, MD, [facility]           n.     eSIGNED:   Venita Lick. Keevon Henney at 04/29/2010 11:04 AM  [facility], [place], [id number]  Note: An exclamation mark (!) indicates a result that was not dispersed into the flowsheet. Document Creation Date: 04/29/2010 11:05 AM _______________________________________________________________________  (1) Order result status: Final Collection or observation date-time: 04/29/2010 10:52 Requested date-time:  Receipt date-time:  Reported date-time:  Referring Physician:   Ordering Physician: Claudette Head [phone]) Specimen Source:  Source: Launa Grill Order Number: [id number] Lab site:

## 2010-05-15 NOTE — Letter (Signed)
Summary: New Patient letter  [facility]  [address] [place], [place] [address]   Phone: [phone]  Fax: [phone]       04/17/2010 MRN: [id number]  [facility] [address] [place], [place]  [address]  Dear Stephen Mcclain,  Welcome to the Gastroenterology Division at [facility].    You are scheduled to see Dr.  Russella Mcclain on 04-21-2010 at 10:15am on the 3rd floor at [facility], [address] [employer]  We ask that you try to arrive at our office 15 minutes prior to your appointment time to allow for check-in.  We would like you to complete the enclosed self-administered evaluation form prior to your visit and bring it with you on the day of your appointment.  We will review it with you.  Also, please bring a complete list of all your medications or, if you prefer, bring the medication bottles and we will list them.  Please bring your insurance card so that we may make a copy of it.  If your insurance requires a referral to see a specialist, please bring your referral form from your primary care physician.  Co-payments are due at the time of your visit and may be paid by cash, check or credit card.     Your office visit will consist of a consult with your physician (includes a physical exam), any laboratory testing he/she may order, scheduling of any necessary diagnostic testing (e.g. x-ray, ultrasound, CT-scan), and scheduling of a procedure (e.g. Endoscopy, Colonoscopy) if required.  Please allow enough time on your schedule to allow for any/all of these possibilities.    If you cannot keep your appointment, please call [phone] to cancel or reschedule prior to your appointment date.  This allows [place] the opportunity to schedule an appointment for another patient in need of care.  If you do not cancel or reschedule by 5 p.m. the business day prior to your appointment date, you will be charged a $50.00 late cancellation/no-show fee.    Thank you for choosing [facility] for your medical needs.  We appreciate the opportunity to care for you.  Please visit [place] at our website  to learn more about our practice.                     Sincerely,                                                             The Gastroenterology Division

## 2010-05-15 NOTE — Assessment & Plan Note (Signed)
Summary: ELEVATED LIVER ENZYNES/YF   History of Present Illness Visit Type: Initial Consult Primary GI MD: Elie Goody MD [facility] Primary Provider: Thayer Headings, MD Requesting Provider: Thayer Headings, MD Chief Complaint: elevated LFTs.  History of Present Illness:   This 77 year old male was found to have elevated liver function tests by Dr. Thea Silversmith in December. Liver function tests in June 2011 were normal. AST=139 and ALT=177 on March 28, 2010 and AST=126 and ALT=153 noted on April 08, 2010. Other liver function tests were normal. He had an elevated iron saturation=63%, a positive anti-smooth muscle antibody and an elevated tissue transglutaminase. The other markers were negative. CBC was normal.  He relates no prior history of liver disease, no family history of liver disease and denies heavy alcohol usage. He denies any new medication as well. He has no gastrointestinal complaints, except for chronic intermittent reflux symptoms, and chronic constipation.   GI Review of Systems    Reports acid reflux and  chest pain.      Denies abdominal pain, belching, bloating, dysphagia with liquids, dysphagia with solids, heartburn, loss of appetite, nausea, vomiting, vomiting blood, weight loss, and  weight gain.      Reports constipation and  liver problems.     Denies anal fissure, black tarry stools, change in bowel habit, diarrhea, diverticulosis, fecal incontinence, heme positive stool, hemorrhoids, irritable bowel syndrome, jaundice, light color stool, rectal bleeding, and  rectal pain.   Current Medications (verified): 1)  Multivitamins  Tabs (Multiple Vitamin) .Marland Kitchen.. 1 Once Daily 2)  Vision Vitamins  Tabs (Multiple Vitamins-Minerals) .... Take 1 Tablet By Mouth Once A Day 3)  Ultimate Bladder Tabs .... Take 1 Tablet By Mouth Once A Day 4)  Vitamin D3 5,000i.u. .... Take 1 Capsule By Mouth Once A Day 5)  Lorazepam 1 Mg Tabs (Lorazepam) .Marland Kitchen.. 1 At Bedtime 6)  Mucinex 600 Mg  Xr12h-Tab (Guaifenesin) .... Per Bottle 7)  Bronchoril .... Per Bottle 8)  Advil 200 Mg Tabs (Ibuprofen) .... Per Bottle 9)  [employer] Sleep Aid 50 Mg Caps (Diphenhydramine Hcl (Sleep)) .Marland Kitchen.. 1 At Bedtime As Needed 10)  Flora Source .Marland Kitchen.. 1 Once Daily 11)  Bioactive Q 100 Mg .Marland Kitchen.. 1 Once Daily 12)  Integrative Digestive Formula .Marland Kitchen.. 1 Once Daily 13)  Breathe Clear .Marland Kitchen.. 1 By Mouth Once Daily 14)  Omega-3 350 Mg Caps (Omega-3 Fatty Acids) .Marland Kitchen.. 1 By Mouth Once Daily 15)  Nasonex 50 Mcg/act Susp (Mometasone Furoate) .... As Directed  Allergies (verified): No Known Drug Allergies  Past History:  Past Medical History: Reviewed history from 04/18/2010 and no changes required. Hx of PROSTATE CANCER (ICD-185) Chronic rhinitis     - Sinus Ct  May 17 2009 > pos acute and chronic sinusitis...............Marland Kitchen Dr. Jearld Fenton Adenomatous Colon Polyps 03/2001 Hemorrhoids ED Hyperlipidemia Chronic renal failure Chronic insomnia BPH  Past Surgical History: Reviewed history from 04/18/2010 and no changes required. Hemorrhoidectomy T & A Cyst removed from arm Cataract Extraction Seed Implant of the prostate 1999  Family History: Reviewed history from 04/18/2010 and no changes required. Heart dz- Father  Family History of Colon Polyps: Mother, Brother, Sister No FH of Colon Cancer:  Social History: Reviewed history from 05/16/2009 and no changes required. Divorced Children Retired Never smoker ETOH Occ Illicit Drug Use - no Drug Use:  no  Review of Systems       The patient complains of allergy/sinus and vision changes.         The pertinent positives and  negatives are noted as above and in the HPI. All other ROS were reviewed and were negative.   Vital Signs:  Patient profile:   77 year old male Height:      73 inches Weight:      211 pounds BMI:     27.94 Pulse rate:   60 / minute Pulse rhythm:   regular BP sitting:   98 / 62  (left arm)  Vitals Entered By: Milford Cage NCMA  (April 21, 2010 10:16 AM)  Physical Exam  General:  Well developed, well nourished, no acute distress. Head:  Normocephalic and atraumatic. Eyes:  PERRLA, no icterus. Ears:  Normal auditory acuity. Mouth:  No deformity or lesions, dentition normal. Lungs:  Clear throughout to auscultation. Heart:  Regular rate and rhythm; no murmurs, rubs,  or bruits. Abdomen:  Soft, nontender and nondistended. No masses, hepatosplenomegaly or hernias noted. Normal bowel sounds. Pulses:  Normal pulses noted. Extremities:  No clubbing, cyanosis, edema or deformities noted. Neurologic:  Alert and  oriented x4;  grossly normal neurologically. Cervical Nodes:  No significant cervical adenopathy. Inguinal Nodes:  No significant inguinal adenopathy. Psych:  Alert and cooperative. Normal mood and affect.  Impression & Recommendations:  Problem # 1:  NONSPEC ELEVATION OF LEVELS OF TRANSAMINASE/LDH (ICD-790.4) Transaminases elevated in the 4 times normal range without a clear etiology. Rule out hemachromatosis, celiac disease. Obtain additional serologies, and genotype for hemochromatosis. Repeat liver function tests and obtain a prothrombin time. Consider liver biopsy if his liver function abnormalities persist and no etiology is uncovered.  Orders: TLB-Hepatic/Liver Function Pnl (80076-HEPATIC) TLB-PT (Protime) (85610-PTP) T-Hemochromatosis [id number]) T-Alpha-1-Antitrypsin Tot (91478-29562) T-AMA (13086) T-ANA (57846-96295) EGD (EGD)  Problem # 2:  GERD (ICD-530.81) Has mild chronic reflux symptoms, and elevated tissue transglutaminase. Rule out celiac disease, erosive esophagitis and Barrett's esophagus. The risks, benefits and alternatives to endoscopy with possible biopsy and possible dilation were discussed with the patient and they consent to proceed. The procedure will be scheduled electively.  Problem # 3:  PERSONAL HX COLONIC POLYPS (ICD-V12.72) Prior history of adenomatous colon  polyps, initially diagnosed in 2002. A five-year surveillance colonoscopy was recommended which will be due in October 2012.  Patient Instructions: 1)  Please go directly to the basement to have your labs drawn.  2)  Upper Endoscopy brochure given.  3)  Please schedule a follow-up appointment in 3-4 weeks.  4)  Copy sent to : Thayer Headings, MD 5)  The medication list was reviewed and reconciled.  All changed / newly prescribed medications were explained.  A complete medication list was provided to the patient / caregiver.

## 2010-05-15 NOTE — Letter (Signed)
Summary: Patient [facility] Biopsy Results  [facility]  [address] [place], [place] [address]   Phone: [phone]  Fax: [phone]        May 06, 2010 MRN: [id number]    Chang Pincus [address] [place], [place]  [address]    Dear Mr. Pickerill,  I am pleased to inform you that the biopsies taken during your recent endoscopic examination did not show any evidence of cancer upon pathologic examination. The biopsies showed duodenitis and esophagitis.  Continue with the treatment plan as outlined on the day of your      exam.  Please call us if you are having persistent problems or have questions about your condition that have not been fully answered at this time.  Sincerely,  Meryl Dare MD [facility]  This letter has been electronically signed by your physician.  Appended Document: Patient Notice-Endo Biopsy Results LETTER MAILED

## 2010-05-15 NOTE — Letter (Signed)
Summary: [facility] [facility]  [facility] [facility]   Imported By: Sherian Rein 05/30/2009 07:55:00  _____________________________________________________________________  External Attachment:    Type:   Image     Comment:   External Document

## 2010-05-15 NOTE — Consult Note (Signed)
Summary: [facility]  [facility]   Imported By: Lester  05/27/2009 08:28:30  _____________________________________________________________________  External Attachment:    Type:   Image     Comment:   External Document

## 2010-05-27 ENCOUNTER — Ambulatory Visit (INDEPENDENT_AMBULATORY_CARE_PROVIDER_SITE_OTHER): Payer: Medicare Other | Admitting: Gastroenterology

## 2010-05-27 ENCOUNTER — Encounter: Payer: Self-pay | Admitting: Gastroenterology

## 2010-05-27 ENCOUNTER — Other Ambulatory Visit: Payer: Self-pay | Admitting: Gastroenterology

## 2010-05-27 DIAGNOSIS — K219 Gastro-esophageal reflux disease without esophagitis: Secondary | ICD-10-CM

## 2010-05-27 DIAGNOSIS — R740 Nonspecific elevation of levels of transaminase and lactic acid dehydrogenase [LDH]: Secondary | ICD-10-CM

## 2010-05-27 LAB — PROTIME-INR
INR: 1 ratio (ref 0.8–1.0)
Prothrombin Time: 11.2 s (ref 10.2–12.4)

## 2010-05-27 LAB — HEPATIC FUNCTION PANEL
ALT: 103 U/L — ABNORMAL HIGH (ref 0–53)
Total Bilirubin: 1.2 mg/dL (ref 0.3–1.2)

## 2010-06-04 NOTE — Assessment & Plan Note (Signed)
Summary: F/U Visit Test results   History of Present Illness Visit Type: Follow-up Visit Primary GI MD: Elie Goody MD [facility] Primary Provider: Thayer Headings, MD Requesting Provider: Thayer Headings, MD Chief Complaint: Patient wishes to discuss test results History of Present Illness:   Stephen Mcclain returns for followup of elevated liver function tests, and GERD. Hepatic serologic evaluation was unremarkable, including genetic testing for hemochromatosis. He is gastrointestinal complaints. His reflux symptoms are well controlled on omeprazole. I reviewed the results of his EGD and biopsies with him.   GI Review of Systems      Denies abdominal pain, acid reflux, belching, bloating, chest pain, dysphagia with liquids, dysphagia with solids, heartburn, loss of appetite, nausea, vomiting, vomiting blood, weight loss, and  weight gain.        Denies anal fissure, black tarry stools, change in bowel habit, constipation, diarrhea, diverticulosis, fecal incontinence, heme positive stool, hemorrhoids, irritable bowel syndrome, jaundice, light color stool, liver problems, rectal bleeding, and  rectal pain.   Current Medications (verified): 1)  Multivitamins  Tabs (Multiple Vitamin) .Marland Kitchen.. 1 Once Daily 2)  Vision Vitamins  Tabs (Multiple Vitamins-Minerals) .... Take 1 Tablet By Mouth Once A Day 3)  Ultimate Bladder Tabs .... Take 1 Tablet By Mouth Once A Day 4)  Vitamin D3 2000 Unit Caps (Cholecalciferol) .... Take 1 Capsule By Mouth Once Daily 5)  Lorazepam 1 Mg Tabs (Lorazepam) .Marland Kitchen.. 1 At Bedtime 6)  Advil 200 Mg Tabs (Ibuprofen) .... Per Bottle 7)  Cvs Sleep Aid 50 Mg Caps (Diphenhydramine Hcl (Sleep)) .Marland Kitchen.. 1 At Bedtime As Needed 8)  Flora Source .Marland Kitchen.. 1 Once Daily 9)  Bioactive Q 100 Mg .Marland Kitchen.. 1 Once Daily 10)  Breathe Clear .Marland Kitchen.. 1 By Mouth Once Daily 11)  Omega-3 350 Mg Caps (Omega-3 Fatty Acids) .Marland Kitchen.. 1 By Mouth Once Daily 12)  Nasonex 50 Mcg/act Susp (Mometasone Furoate) .... As  Directed 13)  Omeprazole 20 Mg  Cpdr (Omeprazole) .Marland Kitchen.. 1 Each Day 30 Minutes Before Meal Every Morning.  Allergies (verified): No Known Drug Allergies  Past History:  Past Medical History: Reviewed history from 05/22/2010 and no changes required. Hx of PROSTATE CANCER (ICD-185) Chronic rhinitis     - Sinus Ct  May 17 2009 > pos acute and chronic sinusitis...............Marland Kitchen Dr. Jearld Fenton Adenomatous Colon Polyps 03/2001 Hemorrhoids ED Hyperlipidemia Chronic renal failure Chronic insomnia BPH Stricture at the gastroesophageal junction Duodenitis in the bulb of duodenum Small hiatal hernia  Past Surgical History: Reviewed history from 04/18/2010 and no changes required. Hemorrhoidectomy T & A Cyst removed from arm Cataract Extraction Seed Implant of the prostate 1999  Family History: Reviewed history from 04/21/2010 and no changes required. Heart dz- Father  Family History of Colon Polyps: Mother, Brother, Sister No FH of Colon Cancer:  Social History: Reviewed history from 04/21/2010 and no changes required. Divorced Children Retired Never smoker ETOH Occ Illicit Drug Use - no  Review of Systems       The pertinent positives and negatives are noted as above and in the HPI. All other ROS were reviewed and were negative.   Vital Signs:  Patient profile:   77 year old male Height:      73 inches Weight:      209.50 pounds BMI:     27.74 Pulse rate:   68 / minute Pulse rhythm:   regular BP sitting:   110 / 74  (left arm) Cuff size:   regular  Vitals Entered By: June  McMurray CMA Duncan Dull) (May 27, 2010 9:49 AM)  Physical Exam  General:  Well developed, well nourished, no acute distress. Head:  Normocephalic and atraumatic. Eyes:  PERRLA, no icterus. Mouth:  No deformity or lesions, dentition normal. Lungs:  Clear throughout to auscultation. Heart:  Regular rate and rhythm; no murmurs, rubs,  or bruits. Abdomen:  Soft, nontender and nondistended. No  masses, hepatosplenomegaly or hernias noted. Normal bowel sounds. Psych:  Alert and cooperative. Normal mood and affect.  Impression & Recommendations:  Problem # 1:  NONSPEC ELEVATION OF LEVELS OF TRANSAMINASE/LDH (ICD-790.4) No clear etiology for his elevated liver enzymes. Monitor liver function tests and if they remain abnormal for 5-6 months plan to proceed with liver biopsy. Orders: TLB-Hepatic/Liver Function Pnl (80076-HEPATIC) TLB-PT (Protime) (85610-PTP)  Problem # 2:  GERD (ICD-530.81) Continue omeprazole 20 mg daily long term and antireflux measures.  Problem # 3:  PERSONAL HX COLONIC POLYPS (ICD-V12.72) Prior history of adenomatous colon polyps, initially diagnosed in 2002. A five-year surveillance colonoscopy was recommended which will be due in October 2012.  Patient Instructions: 1)  Please go directly to the basement to have your labs drawn.  2)  We will contact you in 2 months for your follow-up labs.  3)  Please schedule a follow-up appointment in 2 months.  4)  Copy sent to : Thayer Headings, MD 5)  The medication list was reviewed and reconciled.  All changed / newly prescribed medications were explained.  A complete medication list was provided to the patient / caregiver.

## 2010-08-08 ENCOUNTER — Other Ambulatory Visit (INDEPENDENT_AMBULATORY_CARE_PROVIDER_SITE_OTHER): Payer: Medicare Other

## 2010-08-08 ENCOUNTER — Other Ambulatory Visit: Payer: Self-pay | Admitting: Gastroenterology

## 2010-08-08 DIAGNOSIS — R740 Nonspecific elevation of levels of transaminase and lactic acid dehydrogenase [LDH]: Secondary | ICD-10-CM

## 2010-08-08 LAB — HEPATIC FUNCTION PANEL
ALT: 46 U/L (ref 0–53)
AST: 41 U/L — ABNORMAL HIGH (ref 0–37)
Bilirubin, Direct: 0.1 mg/dL (ref 0.0–0.3)
Total Protein: 6.3 g/dL (ref 6.0–8.3)

## 2010-08-12 ENCOUNTER — Encounter: Payer: Self-pay | Admitting: Gastroenterology

## 2010-08-12 ENCOUNTER — Ambulatory Visit (INDEPENDENT_AMBULATORY_CARE_PROVIDER_SITE_OTHER): Payer: Medicare Other | Admitting: Gastroenterology

## 2010-08-12 VITALS — BP 132/74 | HR 62 | Ht 73.0 in | Wt 206.0 lb

## 2010-08-12 DIAGNOSIS — Z86010 Personal history of colonic polyps: Secondary | ICD-10-CM

## 2010-08-12 DIAGNOSIS — K5900 Constipation, unspecified: Secondary | ICD-10-CM

## 2010-08-12 DIAGNOSIS — K219 Gastro-esophageal reflux disease without esophagitis: Secondary | ICD-10-CM

## 2010-08-12 NOTE — Patient Instructions (Addendum)
Start Miralax  Samples 17 grams in 8oz of water 1-3 x daily. After samples are finished, Miralax can purchased over the counter.  High Fiber diet given to patient.  Will contact you for repeat lab work.   CC: Thayer Headings, MD

## 2010-08-12 NOTE — Progress Notes (Signed)
History of Present Illness: This is a 77 year old male here for followup of abnormal liver function tests. They have improved substantially with his most recent liver function tests remarkable only for a minimally elevated AST at 41. No clear etiology for his abnormal liver function test was determined. He has no reflux symptoms or abdominal discomfort maintained on omeprazole. He does complain of chronic constipation he has used ex-lax on a daily basis for many years. He wants recommendations for other medications and treatments which may be more effective and better for his long-term health  Current Medications, Allergies, Past Medical History, Past Surgical History, Family History and Social History were reviewed in [employer] record.  Physical Exam: General: Well developed , well nourished, no acute distress Head: Normocephalic and atraumatic Eyes:  sclerae anicteric, EOMI Ears: Normal auditory acuity Mouth: No deformity or lesions Lungs: Clear throughout to auscultation Heart: Regular rate and rhythm; no murmurs, rubs or bruits Abdomen: Soft, non tender and non distended. No masses, hepatosplenomegaly or hernias noted. Normal Bowel sounds Musculoskeletal: Symmetrical with no gross deformities  Pulses:  Normal pulses noted Extremities: No clubbing, cyanosis, edema or deformities noted Neurological: Alert oriented x 4, grossly nonfocal Psychological:  Alert and cooperative. Normal mood and affect  Assessment and Recommendations:  1. Elevated liver function tests. Normalized except for minimally elevated AST. Plan to repeat his liver function panel in 2-3 months. No etiology was determined tobut his LFTs are clearly normalizing.  2. GERD with esophageal stricture. Long-term acid suppression with a PPI and anti-reflux measures.  3. Duodenitis. PPI as above.  4. Chronic constipation. Tortuous elongated colon noted on prior colonoscopy. I recommended a high fiber  diet with fiber supplement and increase water intake. MiraLax 1-3 times daily titrated for adequate bowel movements. Discontinue all laxatives.  5. Personal history of adenomatous colon polyps. Surveillance colonoscopy due in November 2012.

## 2010-08-29 NOTE — Procedures (Signed)
[place]. [facility]  Patient:    Stephen Mcclain, Stephen Mcclain                          MRN: [id number] Adm. Date:  [id number] Disc. Date: [id number] Attending:  Ellwood Handler CC:         Verl Dicker, M.D.                           Procedure Report  DATE OF BIRTH:  06/11/1930.  REFERRING PHYSICIANS: Jaclyn Prime. Lucas Mallow, M.D. Wynn Banker, M.D.  UROLOGIST:  Verl Dicker, M.D.  PREOPERATIVE DIAGNOSIS:  Prostate cancer.  POSTOPERATIVE DIAGNOSIS:  Prostate cancer.  PROCEDURE:  Transperineal needle implantation of radioactive seeds.  ANESTHESIA:  General.  DRAINS:  A 20-French Foley to a leg bag.  DESCRIPTION OF PROCEDURE:  Patient was prepped and draped in the dorsal lithotomy position, after institution of an adequate level of general anesthesia. Ultrasound probe was positioned transrectally.  C-arm was positioned suprapubically. Indwelling Foley catheter was left to straight drain.  Fixation needles were placed according to prearranged coordinates.  Adequate visualization of the prostate was obtained.  Radioactive seeds were then implanted according to prearranged coordinates, see attached sheet, 4 mCi per seed and seeds per needle.  Once all  seeds had been placed according to prearranged coordinates, adequate pictures were taken, both ultrasonically and fluoroscopically.  Transrectal ultrasound was removed, as was the C-arm, and Foley catheter was removed.  Patient was prepped and draped.  Flexible cystoscopy showed a normal urethra and sphincter, nonobstructive prostate, bladder was carefully inspected and showed no retained seeds within the bladder.  Foley catheter was then reinserted and left to straight drain and patient was returned to recovery in satisfactory condition.DD:  07/08/99 TD:  07/09/99 Job: [id number]

## 2010-08-29 NOTE — Op Note (Signed)
[place]. [facility]  Patient:    Stephen Mcclain, Stephen Mcclain                          MRN: [id number] Adm. Date:  [id number] Disc. Date: [id number] Attending:  Ellwood Handler                           Operative Report  NO DICTATION. DD:  07/08/99 TD:  07/09/99 Job: [id number]

## 2010-10-13 ENCOUNTER — Telehealth: Payer: Self-pay

## 2010-10-13 NOTE — Telephone Encounter (Signed)
Called patient and left a message for patient to call back regarding his repeat blood work for elevated LFT's.

## 2010-10-14 NOTE — Telephone Encounter (Signed)
Received labs from Dr. Zebedee Iba office for Dr. Russella Dar to review. Put on his desk for review. Will not contact patient again for repeat lab work since the LFT's were done on 09/23/10.

## 2010-10-27 ENCOUNTER — Other Ambulatory Visit (HOSPITAL_COMMUNITY): Payer: Self-pay | Admitting: Internal Medicine

## 2010-10-27 DIAGNOSIS — R609 Edema, unspecified: Secondary | ICD-10-CM

## 2010-10-30 ENCOUNTER — Ambulatory Visit (HOSPITAL_COMMUNITY): Payer: Medicare Other | Attending: Internal Medicine

## 2010-10-30 DIAGNOSIS — M7989 Other specified soft tissue disorders: Secondary | ICD-10-CM | POA: Insufficient documentation

## 2010-10-30 DIAGNOSIS — I079 Rheumatic tricuspid valve disease, unspecified: Secondary | ICD-10-CM | POA: Insufficient documentation

## 2010-10-30 DIAGNOSIS — I059 Rheumatic mitral valve disease, unspecified: Secondary | ICD-10-CM | POA: Insufficient documentation

## 2010-10-30 DIAGNOSIS — R609 Edema, unspecified: Secondary | ICD-10-CM

## 2010-10-30 DIAGNOSIS — I379 Nonrheumatic pulmonary valve disorder, unspecified: Secondary | ICD-10-CM | POA: Insufficient documentation

## 2010-10-31 ENCOUNTER — Encounter (HOSPITAL_COMMUNITY): Payer: Self-pay | Admitting: Internal Medicine

## 2011-05-04 ENCOUNTER — Other Ambulatory Visit: Payer: Self-pay | Admitting: Gastroenterology

## 2011-05-28 ENCOUNTER — Encounter: Payer: Self-pay | Admitting: Gastroenterology

## 2011-06-25 ENCOUNTER — Ambulatory Visit (AMBULATORY_SURGERY_CENTER): Payer: Medicare Other | Admitting: *Deleted

## 2011-06-25 ENCOUNTER — Encounter: Payer: Self-pay | Admitting: Gastroenterology

## 2011-06-25 VITALS — Ht 73.0 in | Wt 208.6 lb

## 2011-06-25 DIAGNOSIS — Z1211 Encounter for screening for malignant neoplasm of colon: Secondary | ICD-10-CM

## 2011-06-25 MED ORDER — PEG-KCL-NACL-NASULF-NA ASC-C 100 G PO SOLR: ORAL | Status: DC

## 2011-07-09 ENCOUNTER — Encounter: Payer: Self-pay | Admitting: Gastroenterology

## 2011-07-09 ENCOUNTER — Ambulatory Visit (AMBULATORY_SURGERY_CENTER): Payer: Medicare Other | Admitting: Gastroenterology

## 2011-07-09 VITALS — BP 144/94 | HR 72 | Temp 97.3°F | Resp 20 | Ht 73.0 in | Wt 208.0 lb

## 2011-07-09 DIAGNOSIS — D126 Benign neoplasm of colon, unspecified: Secondary | ICD-10-CM

## 2011-07-09 DIAGNOSIS — Z86010 Personal history of colonic polyps: Secondary | ICD-10-CM

## 2011-07-09 DIAGNOSIS — Z1211 Encounter for screening for malignant neoplasm of colon: Secondary | ICD-10-CM

## 2011-07-09 MED ORDER — SODIUM CHLORIDE 0.9 % IV SOLN: 500.0000 mL | INTRAVENOUS | Status: DC

## 2011-07-09 NOTE — Op Note (Signed)
[facility] [address] [place], [place]  [address]  COLONOSCOPY PROCEDURE REPORT  PATIENT:  [name], [name]  MR#:  [id number] BIRTHDATE:  Jan 06, 1931, 81 yrs. old  GENDER:  male ENDOSCOPIST:  Judie Petit T. Russella Dar, MD, Stephen Mcclain  PROCEDURE DATE:  07/09/2011 PROCEDURE:  Colonoscopy with snare polypectomy ASA CLASS:  Class II INDICATIONS:  1) surveillance and high-risk screening  2) history of pre-cancerous (adenomatous) colon polyps: 2002, 2004 MEDICATIONS:   These medications were titrated to patient response per physician's verbal order, Fentanyl 100 mcg IV, Versed 10 mg IV DESCRIPTION OF PROCEDURE:   After the risks benefits and alternatives of the procedure were thoroughly explained, informed consent was obtained.  Digital rectal exam was performed and revealed no abnormalities. The [id number] endoscope was introduced through the anus and advanced to the cecum, which was identified by both the appendix and ileocecal valve, limited by a tortuous and redundant colon.  The quality of the prep was adequate, using MoviPrep.  The instrument was then slowly withdrawn as the colon was fully examined. <<PROCEDUREIMAGES>>  FINDINGS:  A sessile polyp was found in the ascending colon. It was 8 mm in size. Polyp was snared, then cauterized with monopolar cautery. Retrieval was successful. A sessile polyp was found in the mid transverse colon. It was 6 mm in size. Polyp was snared without cautery. Retrieval was successful. A sessile polyp was found in the sigmoid colon. It was 5 mm in size. Polyp was snared without cautery. Retrieval was successful.  Mild diverticulosis was found in the sigmoid colon.  Otherwise normal colonoscopy without other polyps, masses, vascular ectasias, or inflammatory changes.   Retroflexed views in the rectum revealed no abnormalities.  The time to cecum =  5.25  minutes. The scope was then withdrawn (time =  14.25  min) from the patient and the procedure  completed.  COMPLICATIONS:  None  ENDOSCOPIC IMPRESSION: 1) 8 mm sessile polyp in the ascending colon 2) 6 mm sessile polyp in the mid transverse colon 3) 5 mm sessile polyp in the sigmoid colon 4) Mild diverticulosis in the sigmoid colon  RECOMMENDATIONS: 1) Await pathology results 2) High fiber diet with liberal fluid intake. 3) Given your age, you will not need another colonoscopy for colon cancer screening or polyp surveillance. These types of tests usually stop around the age 65.  Stephen Lick. Russella Dar, MD, Clementeen Graham  CC:  Stephen Headings, MD  n. Stephen DoctorVenita Lick. Rochell Mabie at 07/09/2011 11:55 AM  Claybon Jabs, [phone]

## 2011-07-09 NOTE — Patient Instructions (Signed)
YOU HAD AN ENDOSCOPIC PROCEDURE TODAY AT THE [facility]: Refer to the procedure report that was given to you for any specific questions about what was found during the examination.  If the procedure report does not answer your questions, please call your gastroenterologist to clarify.  If you requested that your care partner not be given the details of your procedure findings, then the procedure report has been included in a sealed envelope for you to review at your convenience later.  YOU SHOULD EXPECT: Some feelings of bloating in the abdomen. Passage of more gas than usual.  Walking can help get rid of the air that was put into your GI tract during the procedure and reduce the bloating. If you had a lower endoscopy (such as a colonoscopy or flexible sigmoidoscopy) you may notice spotting of blood in your stool or on the toilet paper. If you underwent a bowel prep for your procedure, then you may not have a normal bowel movement for a few days.  DIET: Your first meal following the procedure should be a light meal and then it is ok to progress to your normal diet.  A half-sandwich or bowl of soup is an example of a good first meal.  Heavy or fried foods are harder to digest and may make you feel nauseous or bloated.  Likewise meals heavy in dairy and vegetables can cause extra gas to form and this can also increase the bloating.  Drink plenty of fluids but you should avoid alcoholic beverages for 24 hours.  ACTIVITY: Your care partner should take you home directly after the procedure.  You should plan to take it easy, moving slowly for the rest of the day.  You can resume normal activity the day after the procedure however you should NOT DRIVE or use heavy machinery for 24 hours (because of the sedation medicines used during the test).    SYMPTOMS TO REPORT IMMEDIATELY: A gastroenterologist can be reached at any hour.  During normal business hours, 8:30 AM to 5:00 PM Monday through Friday,  call [phone].  After hours and on weekends, please call the GI answering service at [phone] who will take a message and have the physician on call contact you.   Following lower endoscopy (colonoscopy or flexible sigmoidoscopy):  Excessive amounts of blood in the stool  Significant tenderness or worsening of abdominal pains  Swelling of the abdomen that is new, acute  Fever of 100F or higher    FOLLOW UP: If any biopsies were taken you will be contacted by phone or by letter within the next 1-3 weeks.  Call your gastroenterologist if you have not heard about the biopsies in 3 weeks.  Our staff will call the home number listed on your records the next business day following your procedure to check on you and address any questions or concerns that you may have at that time regarding the information given to you following your procedure. This is a courtesy call and so if there is no answer at the home number and we have not heard from you through the emergency physician on call, we will assume that you have returned to your regular daily activities without incident.  SIGNATURES/CONFIDENTIALITY: You and/or your care partner have signed paperwork which will be entered into your electronic medical record.  These signatures attest to the fact that that the information above on your After Visit Summary has been reviewed and is understood.  Full responsibility of the confidentiality   of this discharge information lies with you and/or your care-partner.     

## 2011-07-09 NOTE — Progress Notes (Signed)
Patient did not have preoperative order for IV antibiotic SSI prophylaxis. (G8918)  Patient did not experience any of the following events: a burn prior to discharge; a fall within the facility; wrong site/side/patient/procedure/implant event; or a hospital transfer or hospital admission upon discharge from the facility. (G8907)  

## 2011-07-13 ENCOUNTER — Telehealth: Payer: Self-pay | Admitting: *Deleted

## 2011-07-13 NOTE — Telephone Encounter (Signed)
  Follow up Call-  Call back number 07/09/2011  Post procedure Call Back phone  # [phone]  Permission to leave phone message Yes     Patient questions:  Do you have a fever, pain , or abdominal swelling? no Pain Score  0 *  Have you tolerated food without any problems? yes  Have you been able to return to your normal activities? yes  Do you have any questions about your discharge instructions: Diet   no Medications  no Follow up visit  yes  Do you have questions or concerns about your Care? no  Actions: * If pain score is 4 or above: No action needed, pain <4.

## 2011-07-16 ENCOUNTER — Encounter: Payer: Self-pay | Admitting: Gastroenterology

## 2011-09-23 ENCOUNTER — Other Ambulatory Visit: Payer: Self-pay | Admitting: Gastroenterology

## 2012-10-19 ENCOUNTER — Other Ambulatory Visit (HOSPITAL_COMMUNITY): Payer: Self-pay | Admitting: Internal Medicine

## 2012-10-19 DIAGNOSIS — I340 Nonrheumatic mitral (valve) insufficiency: Secondary | ICD-10-CM

## 2012-10-26 ENCOUNTER — Ambulatory Visit (HOSPITAL_COMMUNITY)
Admission: RE | Admit: 2012-10-26 | Discharge: 2012-10-26 | Disposition: A | Discharge status: Home / Self Care | Payer: Medicare Other | Source: Ambulatory Visit | Attending: Internal Medicine | Admitting: Internal Medicine

## 2012-10-26 DIAGNOSIS — I517 Cardiomegaly: Secondary | ICD-10-CM

## 2012-10-26 DIAGNOSIS — R0609 Other forms of dyspnea: Secondary | ICD-10-CM | POA: Insufficient documentation

## 2012-10-26 DIAGNOSIS — C61 Malignant neoplasm of prostate: Secondary | ICD-10-CM | POA: Insufficient documentation

## 2012-10-26 DIAGNOSIS — I340 Nonrheumatic mitral (valve) insufficiency: Secondary | ICD-10-CM

## 2012-10-26 DIAGNOSIS — R011 Cardiac murmur, unspecified: Secondary | ICD-10-CM | POA: Insufficient documentation

## 2012-10-26 DIAGNOSIS — R0989 Other specified symptoms and signs involving the circulatory and respiratory systems: Secondary | ICD-10-CM | POA: Insufficient documentation

## 2012-10-26 DIAGNOSIS — K219 Gastro-esophageal reflux disease without esophagitis: Secondary | ICD-10-CM | POA: Insufficient documentation

## 2012-10-26 NOTE — Progress Notes (Signed)
*  PRELIMINARY RESULTS* Echocardiogram 2D Echocardiogram has been performed.  Stephen Mcclain 10/26/2012, 2:23 PM

## 2012-11-04 ENCOUNTER — Encounter: Payer: Medicare Other | Admitting: Internal Medicine

## 2012-11-11 ENCOUNTER — Ambulatory Visit (INDEPENDENT_AMBULATORY_CARE_PROVIDER_SITE_OTHER): Payer: Medicare Other | Admitting: Internal Medicine

## 2012-11-11 DIAGNOSIS — Z7184 Encounter for health counseling related to travel: Secondary | ICD-10-CM

## 2012-11-11 DIAGNOSIS — Z23 Encounter for immunization: Secondary | ICD-10-CM

## 2012-11-11 DIAGNOSIS — Z7189 Other specified counseling: Secondary | ICD-10-CM

## 2012-11-11 MED ORDER — AZITHROMYCIN 500 MG PO TABS: 500.0000 mg | ORAL_TABLET | Freq: Every day | ORAL | Status: DC

## 2012-11-11 NOTE — Progress Notes (Signed)
  Subjective:    Stephen Mcclain is a 79 y.o. male who presents to the Infectious Disease clinic for travel consultation. Planned departure date: August 30          Planned return date: 2 weeks Countries of travel: [place], [place]  and [place] Areas in country: cruise   Accommodations: hotel Purpose of travel: vacation Prior travel out of [place]: yes Currently ill / Fever: no History of liver or kidney disease: no  Data Review:  none   Review of Systems n/a    Objective:    n/a    Assessment:    No contraindications to travel. none      Plan:    Issues discussed: future shots, insect-borne illnesses, malaria, MVA safety, rabies, safe food/water, traveler's diarrhea, website/handouts for more information, what to do if ill upon return and what to do if ill while there. Immunizations recommended: Typhoid (parenteral). Malaria prophylaxis: not indicated Traveler's diarrhea prophylaxis: ciprofloxacin.  Subjective:    Stephen Mcclain is a 79 y.o. male who presents to the Infectious Disease clinic for travel consultation. Planned departure date: Aug          Planned return date: 2 weeks Countries of travel: [place], [place]  and [place] Areas in country: urban   Accommodations: hotel Purpose of travel: vacation Prior travel out of [place]: yes Currently ill / Fever: no History of liver or kidney disease: no  Data Review:  n/a   Review of Systems n/a    Objective:    n/a    Assessment:    No contraindications to travel. None       Plan:    Issues discussed: environmental concerns, future shots, malaria, MVA safety, rabies, safe food/water, traveler's diarrhea, website/handouts for more information, what to do if ill upon return and what to do if ill while there. Immunizations recommended: Typhoid (parenteral). Malaria prophylaxis: not indicated Traveler's diarrhea prophylaxis: azithromycin.

## 2013-01-18 ENCOUNTER — Other Ambulatory Visit: Payer: Self-pay | Admitting: Otolaryngology

## 2013-01-18 DIAGNOSIS — R0981 Nasal congestion: Secondary | ICD-10-CM

## 2013-01-20 ENCOUNTER — Inpatient Hospital Stay: Admission: RE | Admit: 2013-01-20 | Payer: Medicare Other | Source: Ambulatory Visit

## 2013-01-23 ENCOUNTER — Ambulatory Visit
Admission: RE | Admit: 2013-01-23 | Discharge: 2013-01-23 | Disposition: A | Discharge status: Home / Self Care | Payer: Medicare Other | Source: Ambulatory Visit | Attending: Otolaryngology | Admitting: Otolaryngology

## 2013-01-23 DIAGNOSIS — R0981 Nasal congestion: Secondary | ICD-10-CM

## 2014-05-29 DIAGNOSIS — E785 Hyperlipidemia, unspecified: Secondary | ICD-10-CM | POA: Diagnosis not present

## 2014-05-29 DIAGNOSIS — E559 Vitamin D deficiency, unspecified: Secondary | ICD-10-CM | POA: Diagnosis not present

## 2014-05-29 DIAGNOSIS — N182 Chronic kidney disease, stage 2 (mild): Secondary | ICD-10-CM | POA: Diagnosis not present

## 2014-06-05 DIAGNOSIS — Z23 Encounter for immunization: Secondary | ICD-10-CM | POA: Diagnosis not present

## 2014-06-05 DIAGNOSIS — N182 Chronic kidney disease, stage 2 (mild): Secondary | ICD-10-CM | POA: Diagnosis not present

## 2014-06-05 DIAGNOSIS — E785 Hyperlipidemia, unspecified: Secondary | ICD-10-CM | POA: Diagnosis not present

## 2014-06-05 DIAGNOSIS — M8580 Other specified disorders of bone density and structure, unspecified site: Secondary | ICD-10-CM | POA: Diagnosis not present

## 2014-06-05 DIAGNOSIS — F419 Anxiety disorder, unspecified: Secondary | ICD-10-CM | POA: Diagnosis not present

## 2014-06-05 DIAGNOSIS — E039 Hypothyroidism, unspecified: Secondary | ICD-10-CM | POA: Diagnosis not present

## 2014-08-13 DIAGNOSIS — J3089 Other allergic rhinitis: Secondary | ICD-10-CM | POA: Diagnosis not present

## 2014-11-20 DIAGNOSIS — J328 Other chronic sinusitis: Secondary | ICD-10-CM | POA: Diagnosis not present

## 2014-11-20 DIAGNOSIS — J342 Deviated nasal septum: Secondary | ICD-10-CM | POA: Diagnosis not present

## 2014-12-10 DIAGNOSIS — L720 Epidermal cyst: Secondary | ICD-10-CM | POA: Diagnosis not present

## 2014-12-20 DIAGNOSIS — E785 Hyperlipidemia, unspecified: Secondary | ICD-10-CM | POA: Diagnosis not present

## 2014-12-20 DIAGNOSIS — E559 Vitamin D deficiency, unspecified: Secondary | ICD-10-CM | POA: Diagnosis not present

## 2014-12-20 DIAGNOSIS — N182 Chronic kidney disease, stage 2 (mild): Secondary | ICD-10-CM | POA: Diagnosis not present

## 2015-01-17 DIAGNOSIS — H524 Presbyopia: Secondary | ICD-10-CM | POA: Diagnosis not present

## 2015-01-22 DIAGNOSIS — J3089 Other allergic rhinitis: Secondary | ICD-10-CM | POA: Diagnosis not present

## 2015-01-22 DIAGNOSIS — J328 Other chronic sinusitis: Secondary | ICD-10-CM | POA: Diagnosis not present

## 2015-01-23 DIAGNOSIS — L720 Epidermal cyst: Secondary | ICD-10-CM | POA: Diagnosis not present

## 2015-01-28 DIAGNOSIS — J324 Chronic pansinusitis: Secondary | ICD-10-CM | POA: Diagnosis not present

## 2015-01-28 DIAGNOSIS — J3089 Other allergic rhinitis: Secondary | ICD-10-CM | POA: Diagnosis not present

## 2015-01-28 DIAGNOSIS — J342 Deviated nasal septum: Secondary | ICD-10-CM | POA: Diagnosis not present

## 2015-01-30 DIAGNOSIS — Z4802 Encounter for removal of sutures: Secondary | ICD-10-CM | POA: Diagnosis not present

## 2015-02-05 ENCOUNTER — Other Ambulatory Visit: Payer: Self-pay | Admitting: Otolaryngology

## 2015-02-05 DIAGNOSIS — J329 Chronic sinusitis, unspecified: Secondary | ICD-10-CM | POA: Diagnosis not present

## 2015-02-05 DIAGNOSIS — J320 Chronic maxillary sinusitis: Secondary | ICD-10-CM | POA: Diagnosis not present

## 2015-02-05 DIAGNOSIS — J342 Deviated nasal septum: Secondary | ICD-10-CM | POA: Diagnosis not present

## 2015-02-05 DIAGNOSIS — J321 Chronic frontal sinusitis: Secondary | ICD-10-CM | POA: Diagnosis not present

## 2015-02-05 DIAGNOSIS — J322 Chronic ethmoidal sinusitis: Secondary | ICD-10-CM | POA: Diagnosis not present

## 2015-02-12 DIAGNOSIS — J324 Chronic pansinusitis: Secondary | ICD-10-CM | POA: Diagnosis not present

## 2015-02-18 DIAGNOSIS — J324 Chronic pansinusitis: Secondary | ICD-10-CM | POA: Diagnosis not present

## 2015-03-05 DIAGNOSIS — J324 Chronic pansinusitis: Secondary | ICD-10-CM | POA: Diagnosis not present

## 2015-03-20 DIAGNOSIS — Z0001 Encounter for general adult medical examination with abnormal findings: Secondary | ICD-10-CM | POA: Diagnosis not present

## 2015-03-20 DIAGNOSIS — Z1389 Encounter for screening for other disorder: Secondary | ICD-10-CM | POA: Diagnosis not present

## 2015-03-20 DIAGNOSIS — J309 Allergic rhinitis, unspecified: Secondary | ICD-10-CM | POA: Diagnosis not present

## 2015-03-20 DIAGNOSIS — Z23 Encounter for immunization: Secondary | ICD-10-CM | POA: Diagnosis not present

## 2015-03-20 DIAGNOSIS — N182 Chronic kidney disease, stage 2 (mild): Secondary | ICD-10-CM | POA: Diagnosis not present

## 2015-03-20 DIAGNOSIS — E785 Hyperlipidemia, unspecified: Secondary | ICD-10-CM | POA: Diagnosis not present

## 2015-03-20 DIAGNOSIS — N529 Male erectile dysfunction, unspecified: Secondary | ICD-10-CM | POA: Diagnosis not present

## 2015-04-02 DIAGNOSIS — H903 Sensorineural hearing loss, bilateral: Secondary | ICD-10-CM | POA: Diagnosis not present

## 2015-04-02 DIAGNOSIS — H9313 Tinnitus, bilateral: Secondary | ICD-10-CM | POA: Diagnosis not present

## 2015-04-02 DIAGNOSIS — J324 Chronic pansinusitis: Secondary | ICD-10-CM | POA: Diagnosis not present

## 2015-12-05 DIAGNOSIS — L570 Actinic keratosis: Secondary | ICD-10-CM | POA: Diagnosis not present

## 2015-12-05 DIAGNOSIS — D1801 Hemangioma of skin and subcutaneous tissue: Secondary | ICD-10-CM | POA: Diagnosis not present

## 2015-12-05 DIAGNOSIS — L821 Other seborrheic keratosis: Secondary | ICD-10-CM | POA: Diagnosis not present

## 2015-12-05 DIAGNOSIS — L565 Disseminated superficial actinic porokeratosis (DSAP): Secondary | ICD-10-CM | POA: Diagnosis not present

## 2016-01-23 DIAGNOSIS — H353132 Nonexudative age-related macular degeneration, bilateral, intermediate dry stage: Secondary | ICD-10-CM | POA: Diagnosis not present

## 2016-03-13 DIAGNOSIS — E559 Vitamin D deficiency, unspecified: Secondary | ICD-10-CM | POA: Diagnosis not present

## 2016-03-13 DIAGNOSIS — E785 Hyperlipidemia, unspecified: Secondary | ICD-10-CM | POA: Diagnosis not present

## 2016-03-13 DIAGNOSIS — Z125 Encounter for screening for malignant neoplasm of prostate: Secondary | ICD-10-CM | POA: Diagnosis not present

## 2016-03-13 DIAGNOSIS — Z0000 Encounter for general adult medical examination without abnormal findings: Secondary | ICD-10-CM | POA: Diagnosis not present

## 2016-03-23 DIAGNOSIS — N182 Chronic kidney disease, stage 2 (mild): Secondary | ICD-10-CM | POA: Diagnosis not present

## 2016-03-23 DIAGNOSIS — E785 Hyperlipidemia, unspecified: Secondary | ICD-10-CM | POA: Diagnosis not present

## 2016-03-23 DIAGNOSIS — Z0000 Encounter for general adult medical examination without abnormal findings: Secondary | ICD-10-CM | POA: Diagnosis not present

## 2016-03-23 DIAGNOSIS — I5032 Chronic diastolic (congestive) heart failure: Secondary | ICD-10-CM | POA: Diagnosis not present

## 2016-04-16 ENCOUNTER — Encounter: Payer: Self-pay | Admitting: Podiatry

## 2016-04-16 ENCOUNTER — Ambulatory Visit (INDEPENDENT_AMBULATORY_CARE_PROVIDER_SITE_OTHER): Payer: 59 | Admitting: Podiatry

## 2016-04-16 VITALS — BP 125/68 | HR 64 | Resp 16 | Ht 73.0 in | Wt 190.0 lb

## 2016-04-16 DIAGNOSIS — M216X9 Other acquired deformities of unspecified foot: Secondary | ICD-10-CM

## 2016-04-16 DIAGNOSIS — Q828 Other specified congenital malformations of skin: Secondary | ICD-10-CM | POA: Diagnosis not present

## 2016-04-17 NOTE — Progress Notes (Signed)
Subjective:     Patient ID: Stephen Mcclain, male   DOB: 1931/01/07, 83 y.o.   MRN: [id number]  HPI patient's not been seen for a long time and complains of a painful callus on the plantar aspect of the left foot that makes ambulation difficult. States he's had it worked on in the past but it's been a fairly long time   Review of Systems  All other systems reviewed and are negative.      Objective:   Physical Exam  Constitutional: He is oriented to person, place, and time.  Cardiovascular: Intact distal pulses.   Musculoskeletal: Normal range of motion.  Neurological: He is oriented to person, place, and time.  Skin: Skin is warm.  Nursing note and vitals reviewed.  neurovascular status was found to be intact with muscle strength adequate and patient's noted to have a lucent keratotic lesion underneath the left foot underneath third metatarsal. There is mild depression of the arch with no equinus condition and patient is found to be well oriented 3 with good digital perfusion     Assessment:     Plantar keratotic lesion left painful when pressed with lucent core    Plan:     H&P condition reviewed and recommended debridement of lesion explaining procedure and the fact will probably recur one point. Using sterile instrumentation debridement accomplished and I applied a small amount of swelling a cane to the area in order to reduce any pathological cells. Applied sterile dressing and will reappoint for [place] to recheck when symptomatic

## 2016-09-10 DIAGNOSIS — M160 Bilateral primary osteoarthritis of hip: Secondary | ICD-10-CM | POA: Diagnosis not present

## 2016-09-15 DIAGNOSIS — E039 Hypothyroidism, unspecified: Secondary | ICD-10-CM | POA: Diagnosis not present

## 2016-09-15 DIAGNOSIS — E785 Hyperlipidemia, unspecified: Secondary | ICD-10-CM | POA: Diagnosis not present

## 2016-09-15 DIAGNOSIS — E559 Vitamin D deficiency, unspecified: Secondary | ICD-10-CM | POA: Diagnosis not present

## 2016-09-15 DIAGNOSIS — N182 Chronic kidney disease, stage 2 (mild): Secondary | ICD-10-CM | POA: Diagnosis not present

## 2016-09-22 DIAGNOSIS — N182 Chronic kidney disease, stage 2 (mild): Secondary | ICD-10-CM | POA: Diagnosis not present

## 2016-09-22 DIAGNOSIS — I5032 Chronic diastolic (congestive) heart failure: Secondary | ICD-10-CM | POA: Diagnosis not present

## 2016-09-22 DIAGNOSIS — E785 Hyperlipidemia, unspecified: Secondary | ICD-10-CM | POA: Diagnosis not present

## 2016-09-22 DIAGNOSIS — E039 Hypothyroidism, unspecified: Secondary | ICD-10-CM | POA: Diagnosis not present

## 2016-11-26 DIAGNOSIS — M1612 Unilateral primary osteoarthritis, left hip: Secondary | ICD-10-CM | POA: Diagnosis not present

## 2016-12-03 DIAGNOSIS — L309 Dermatitis, unspecified: Secondary | ICD-10-CM | POA: Diagnosis not present

## 2016-12-10 DIAGNOSIS — M1612 Unilateral primary osteoarthritis, left hip: Secondary | ICD-10-CM | POA: Diagnosis not present

## 2017-01-07 DIAGNOSIS — M1612 Unilateral primary osteoarthritis, left hip: Secondary | ICD-10-CM | POA: Diagnosis not present

## 2017-01-26 DIAGNOSIS — H52222 Regular astigmatism, left eye: Secondary | ICD-10-CM | POA: Diagnosis not present

## 2017-01-26 DIAGNOSIS — H353132 Nonexudative age-related macular degeneration, bilateral, intermediate dry stage: Secondary | ICD-10-CM | POA: Diagnosis not present

## 2017-02-08 DIAGNOSIS — G4700 Insomnia, unspecified: Secondary | ICD-10-CM | POA: Diagnosis not present

## 2017-02-08 DIAGNOSIS — Z01818 Encounter for other preprocedural examination: Secondary | ICD-10-CM | POA: Diagnosis not present

## 2017-02-08 DIAGNOSIS — M1612 Unilateral primary osteoarthritis, left hip: Secondary | ICD-10-CM | POA: Diagnosis not present

## 2017-02-11 ENCOUNTER — Ambulatory Visit: Payer: Self-pay | Admitting: Orthopedic Surgery

## 2017-02-22 DIAGNOSIS — R21 Rash and other nonspecific skin eruption: Secondary | ICD-10-CM | POA: Diagnosis not present

## 2017-02-22 DIAGNOSIS — J069 Acute upper respiratory infection, unspecified: Secondary | ICD-10-CM | POA: Diagnosis not present

## 2017-02-26 DIAGNOSIS — J019 Acute sinusitis, unspecified: Secondary | ICD-10-CM | POA: Diagnosis not present

## 2017-02-27 ENCOUNTER — Ambulatory Visit: Payer: Self-pay | Admitting: Orthopedic Surgery

## 2017-02-27 NOTE — H&P (Signed)
Stephen Mcclain DOB: [birth year] Divorced / Language: Stephen Mcclain / Race: White Male Date of admission: March 17, 2017`  Chief complaint: Left hip pain History of Present Illness  The patient is a 83 year old male who comes in for a preoperative History and Physical. The patient is scheduled for a left total hip arthroplasty (anterior) to be performed by Dr. Dione Plover. Aluisio, MD at [facility] on 03-17-2017. The patient is a 83 year old male who reported left hip and left anterior hip problems including pain symptoms that have been present for year(s). The symptoms began without any known injury. Symptoms reported include pain with weightbearing and stiffness The patient reports symptoms radiating to the: left groin and left thigh anteriorly. Onset of symptoms was gradual. The patient feels as if their symptoms are does feel they are worsening. Current treatment includes non-opioid analgesics (Tylenol). Prior to being seen the patient was previously evaluated by a primary physician (years ago). This problem has not been previously evaluated. He reports that he has always been very active but he is noticing that he is having to cut down on how much he is walking. He is having pain in the left groin. No radiation of pain. Pain only with weithbearing. Radiographs AP pelvis and lateral LEFT hip show severe end-stage arthritis of that hip with large subchondral cystic formation in the femoral head. He has advanced end-stage arthritis of the LEFT hip.The patient has significant pain and dysfunction in their hip. They have significant functional limitations and have failed nonoperative management. At this point the most predictable means of improving pain and function is total hip arthroplasty. We discussed the procedure risks and potential complication and rehab course in detail. At this time the patient would like to go ahead and proceed with left total hip arthroplasty.   Problem List/Past  Medical Primary osteoarthritis of left hip (M16.12)  Anxiety Disorder  Heart murmur  Prostate Cancer  Rheumatoid Arthritis  Tinnitus  Insomnia  Hypothyroidism  ED  BPH  Mitral Regurgitaion  Cataract   Allergies  No Known Drug Allergies   Family History  Bleeding disorder  Sister. Cancer  Mother, Sister. Cerebrovascular Accident  Mother. Chronic Obstructive Lung Disease  Mother. Congestive Heart Failure  Father, Mother. Depression  Sister. First Degree Relatives  reported Heart Disease  Mother. Heart disease in male family member before age 81  Heart disease in male family member before age 57  Hypertension  Mother, Sister. Osteoarthritis  Mother. Rheumatoid Arthritis  Mother, Sister. Severe allergy  Mother, Sister.  Social History  Children  2 Current drinker  11/26/2016: Currently drinks wine less than 5 times per week Current work status  retired Furniture conservator/restorer weekly; does running / walking Living situation  live with partner Marital status  divorced No history of drug/alcohol rehab  Not under pain contract  Number of flights of stairs before winded  4-5 Tobacco / smoke exposure  11/26/2016: no Tobacco use  Never smoker. 11/26/2016 Post-Surgical Plans  Home With Caregiver. Advance Directives  Living Will.  Medication History  LORazepam (1MG  Tablet, Oral) Active. Multi Vitamin (Oral) Active. Asoxonthin Active. Vitamin D3 Active. Trugent Flex for Joint and Muscle Active. Co-Q-10 Active. Prostate Health Active. Vision Essentials  Active. Flora Source Probiotic Active. Omega Q Plus Active. Flora Sinus Active. Laxative Active. Forward Good Daily Regimen Active.  Past Surgical History  Cataract Surgery  bilateral Colon Polyp Removal - Colonoscopy  Prostatectomy; Transurethral  Sinus Surgery  Prostate Seeding  Review of Systems General Not Present- Chills, Fatigue, Fever, Memory Loss,  Night Sweats, Weight Gain and Weight Loss. Skin Not Present- Eczema, Hives, Itching, Lesions and Rash. HEENT Present- Hearing Loss and Tinnitus. Not Present- Dentures, Double Vision, Headache and Visual Loss. Respiratory Present- Allergies. Not Present- Chronic Cough, Coughing up blood, Shortness of breath at rest and Shortness of breath with exertion. Cardiovascular Not Present- Chest Pain, Difficulty Breathing Lying Down, Murmur, Palpitations, Racing/skipping heartbeats and Swelling. Gastrointestinal Present- Constipation. Not Present- Abdominal Pain, Bloody Stool, Diarrhea, Difficulty Swallowing, Heartburn, Jaundice, Loss of appetitie, Nausea and Vomiting. Male Genitourinary Present- Urinary frequency and Urinating at Night. Not Present- Blood in Urine, Discharge, Flank Pain, Incontinence, Painful Urination, Urgency, Urinary Retention and Weak urinary stream. Musculoskeletal Present- Joint Pain. Not Present- Back Pain, Joint Swelling, Morning Stiffness, Muscle Pain, Muscle Weakness and Spasms. Neurological Not Present- Blackout spells, Difficulty with balance, Dizziness, Paralysis, Tremor and Weakness. Psychiatric Not Present- Insomnia.  Vitals Weight: 190 lb Height: 73in Weight was reported by patient. Height was reported by patient. Body Surface Area: 2.11 m Body Mass Index: 25.07 kg/m  Pulse: 56 (Regular)  BP: 118/60 (Sitting, Right Arm, Standard)   Physical Exam General Mental Status -Alert, cooperative and good historian. General Appearance-pleasant, Not in acute distress. Orientation-Oriented X3. Build & Nutrition-Well nourished and Well developed.  Head and Neck Head-normocephalic, atraumatic . Neck Global Assessment - supple, no bruit auscultated on the right, no bruit auscultated on the left.  Eye Pupil - Bilateral-Regular and Round. Motion - Bilateral-EOMI.  Chest and Lung Exam Auscultation Breath sounds - clear at anterior chest wall and  clear at posterior chest wall. Adventitious sounds - No Adventitious sounds.  Cardiovascular Auscultation Rhythm - Regular rate and rhythm. Heart Sounds - S1 WNL and S2 WNL. Murmurs & Other Heart Sounds - Auscultation of the heart reveals - No Murmurs.  Abdomen Palpation/Percussion Tenderness - Abdomen is non-tender to palpation. Rigidity (guarding) - Abdomen is soft. Auscultation Auscultation of the abdomen reveals - Bowel sounds normal.  Male Genitourinary Note: Not done, not pertinent to present illness   Musculoskeletal Note: His LEFT hip can be flexed to 100 with no internal rotation, 20 of external rotation and 20 of abduction. His RIGHT hip has normal range of motion with no discomfort. His gait pattern is significant antalgic on the LEFT. Pulses, sensation and motor are intact both lower extremities.  Radiographs AP pelvis and lateral LEFT hip show severe end-stage arthritis of that hip with large subchondral cystic formation in the femoral head.   Assessment & Plan Primary osteoarthritis of left hip (M16.12)  Note:Surgical Plans: Left Total Hip Replacement - Anterior Approach  Disposition: Home  PCP: Dr. Deland Pretty - Patient has been seen preoperatively and felt to be stable for surgery.  Topical TXA - Prostate Cancer  Anesthesia Issues: None  Patient was instructed on what medications to stop prior to surgery.  Signed electronically by Joelene Millin, III PA-C

## 2017-02-27 NOTE — H&P (View-Only) (Signed)
Stephen Mcclain DOB: Jul 24, 1930 Divorced / Language: [name] / Race: White Male Date of admission: March 17, 2017`  Chief complaint: Left hip pain History of Present Illness  The patient is a 83 year old male who comes in for a preoperative History and Physical. The patient is scheduled for a left total hip arthroplasty (anterior) to be performed by Dr. Dione Plover. Aluisio, MD at [facility] on 03-17-2017. The patient is a 83 year old male who reported left hip and left anterior hip problems including pain symptoms that have been present for year(s). The symptoms began without any known injury. Symptoms reported include pain with weightbearing and stiffness The patient reports symptoms radiating to the: left groin and left thigh anteriorly. Onset of symptoms was gradual. The patient feels as if their symptoms are does feel they are worsening. Current treatment includes non-opioid analgesics (Tylenol). Prior to being seen the patient was previously evaluated by a primary physician (years ago). This problem has not been previously evaluated. He reports that he has always been very active but he is noticing that he is having to cut down on how much he is walking. He is having pain in the left groin. No radiation of pain. Pain only with weithbearing. Radiographs AP pelvis and lateral LEFT hip show severe end-stage arthritis of that hip with large subchondral cystic formation in the femoral head. He has advanced end-stage arthritis of the LEFT hip.The patient has significant pain and dysfunction in their hip. They have significant functional limitations and have failed nonoperative management. At this point the most predictable means of improving pain and function is total hip arthroplasty. We discussed the procedure risks and potential complication and rehab course in detail. At this time the patient would like to go ahead and proceed with left total hip arthroplasty.   Problem List/Past  Medical Primary osteoarthritis of left hip (M16.12)  Anxiety Disorder  Heart murmur  Prostate Cancer  Rheumatoid Arthritis  Tinnitus  Insomnia  Hypothyroidism  ED  BPH  Mitral Regurgitaion  Cataract   Allergies  No Known Drug Allergies   Family History  Bleeding disorder  Sister. Cancer  Mother, Sister. Cerebrovascular Accident  Mother. Chronic Obstructive Lung Disease  Mother. Congestive Heart Failure  Father, Mother. Depression  Sister. First Degree Relatives  reported Heart Disease  Mother. Heart disease in male family member before age 58  Heart disease in male family member before age 62  Hypertension  Mother, Sister. Osteoarthritis  Mother. Rheumatoid Arthritis  Mother, Sister. Severe allergy  Mother, Sister.  Social History  Children  2 Current drinker  11/26/2016: Currently drinks wine less than 5 times per week Current work status  retired Furniture conservator/restorer weekly; does running / walking Living situation  live with partner Marital status  divorced No history of drug/alcohol rehab  Not under pain contract  Number of flights of stairs before winded  4-5 Tobacco / smoke exposure  11/26/2016: no Tobacco use  Never smoker. 11/26/2016 Post-Surgical Plans  Home With Caregiver. Advance Directives  Living Will.  Medication History  LORazepam (1MG  Tablet, Oral) Active. Multi Vitamin (Oral) Active. Asoxonthin Active. Vitamin D3 Active. Trugent Flex for Joint and Muscle Active. Co-Q-10 Active. Prostate Health Active. Vision Essentials  Active. Flora Source Probiotic Active. Omega Q Plus Active. Flora Sinus Active. Laxative Active. Forward Good Daily Regimen Active.  Past Surgical History  Cataract Surgery  bilateral Colon Polyp Removal - Colonoscopy  Prostatectomy; Transurethral  Sinus Surgery  Prostate Seeding  Review of Systems General Not Present- Chills, Fatigue, Fever, Memory Loss,  Night Sweats, Weight Gain and Weight Loss. Skin Not Present- Eczema, Hives, Itching, Lesions and Rash. HEENT Present- Hearing Loss and Tinnitus. Not Present- Dentures, Double Vision, Headache and Visual Loss. Respiratory Present- Allergies. Not Present- Chronic Cough, Coughing up blood, Shortness of breath at rest and Shortness of breath with exertion. Cardiovascular Not Present- Chest Pain, Difficulty Breathing Lying Down, Murmur, Palpitations, Racing/skipping heartbeats and Swelling. Gastrointestinal Present- Constipation. Not Present- Abdominal Pain, Bloody Stool, Diarrhea, Difficulty Swallowing, Heartburn, Jaundice, Loss of appetitie, Nausea and Vomiting. Male Genitourinary Present- Urinary frequency and Urinating at Night. Not Present- Blood in Urine, Discharge, Flank Pain, Incontinence, Painful Urination, Urgency, Urinary Retention and Weak urinary stream. Musculoskeletal Present- Joint Pain. Not Present- Back Pain, Joint Swelling, Morning Stiffness, Muscle Pain, Muscle Weakness and Spasms. Neurological Not Present- Blackout spells, Difficulty with balance, Dizziness, Paralysis, Tremor and Weakness. Psychiatric Not Present- Insomnia.  Vitals Weight: 190 lb Height: 73in Weight was reported by patient. Height was reported by patient. Body Surface Area: 2.11 m Body Mass Index: 25.07 kg/m  Pulse: 56 (Regular)  BP: 118/60 (Sitting, Right Arm, Standard)   Physical Exam General Mental Status -Alert, cooperative and good historian. General Appearance-pleasant, Not in acute distress. Orientation-Oriented X3. Build & Nutrition-Well nourished and Well developed.  Head and Neck Head-normocephalic, atraumatic . Neck Global Assessment - supple, no bruit auscultated on the right, no bruit auscultated on the left.  Eye Pupil - Bilateral-Regular and Round. Motion - Bilateral-EOMI.  Chest and Lung Exam Auscultation Breath sounds - clear at anterior chest wall and  clear at posterior chest wall. Adventitious sounds - No Adventitious sounds.  Cardiovascular Auscultation Rhythm - Regular rate and rhythm. Heart Sounds - S1 WNL and S2 WNL. Murmurs & Other Heart Sounds - Auscultation of the heart reveals - No Murmurs.  Abdomen Palpation/Percussion Tenderness - Abdomen is non-tender to palpation. Rigidity (guarding) - Abdomen is soft. Auscultation Auscultation of the abdomen reveals - Bowel sounds normal.  Male Genitourinary Note: Not done, not pertinent to present illness   Musculoskeletal Note: His LEFT hip can be flexed to 100 with no internal rotation, 20 of external rotation and 20 of abduction. His RIGHT hip has normal range of motion with no discomfort. His gait pattern is significant antalgic on the LEFT. Pulses, sensation and motor are intact both lower extremities.  Radiographs AP pelvis and lateral LEFT hip show severe end-stage arthritis of that hip with large subchondral cystic formation in the femoral head.   Assessment & Plan Primary osteoarthritis of left hip (M16.12)  Note:Surgical Plans: Left Total Hip Replacement - Anterior Approach  Disposition: Home  PCP: Dr. Deland Pretty - Patient has been seen preoperatively and felt to be stable for surgery.  Topical TXA - Prostate Cancer  Anesthesia Issues: None  Patient was instructed on what medications to stop prior to surgery.  Signed electronically by Joelene Millin, III PA-C

## 2017-03-11 NOTE — Patient Instructions (Signed)
Stephen Mcclain  03/11/2017   Your procedure is scheduled on: 03-17-17  Report to [facility] Main  Entrance   Report to admitting at     1100 AM   Call this number if you have problems the morning of surgery  [phone]   Remember: ONLY 1 PERSON MAY GO WITH YOU TO SHORT STAY TO GET  READY MORNING OF YOUR SURGERY.  Do not eat food  :After Midnight.YOU MAY HAVE CLEARS UNTIL 0730 AM THEN NOTHING BY MOUTH     Take these medicines the morning of surgery with A SIP OF WATER: NONE                                You may not have any metal on your body including hair pins and              piercings  Do not wear jewelry,lotions, powders or perfumes, deodorant                       Men may shave face and neck.   Do not bring valuables to the hospital. [place].  Contacts, dentures or bridgework may not be worn into surgery.  Leave suitcase in the car. After surgery it may be brought to your room.               Please read over the following fact sheets you were given: _____________________________________________________________________          [facility] - Preparing for Surgery Before surgery, you can play an important role.  Because skin is not sterile, your skin needs to be as free of germs as possible.  You can reduce the number of germs on your skin by washing with CHG (chlorahexidine gluconate) soap before surgery.  CHG is an antiseptic cleaner which kills germs and bonds with the skin to continue killing germs even after washing. Please DO NOT use if you have an allergy to CHG or antibacterial soaps.  If your skin becomes reddened/irritated stop using the CHG and inform your nurse when you arrive at Short Stay. Do not shave (including legs and underarms) for at least 48 hours prior to the first CHG shower.  You may shave your face/neck. Please follow these instructions carefully:  1.  Shower with CHG Soap the  night before surgery and the  morning of Surgery.  2.  If you choose to wash your hair, wash your hair first as usual with your  normal  shampoo.  3.  After you shampoo, rinse your hair and body thoroughly to remove the  shampoo.                           4.  Use CHG as you would any other liquid soap.  You can apply chg directly  to the skin and wash                       Gently with a scrungie or clean washcloth.  5.  Apply the CHG Soap to your body ONLY FROM THE NECK DOWN.   Do not use on face/ open  Wound or open sores. Avoid contact with eyes, ears mouth and genitals (private parts).                       Wash face,  Genitals (private parts) with your normal soap.             6.  Wash thoroughly, paying special attention to the area where your surgery  will be performed.  7.  Thoroughly rinse your body with warm water from the neck down.  8.  DO NOT shower/wash with your normal soap after using and rinsing off  the CHG Soap.                9.  Pat yourself dry with a clean towel.            10.  Wear clean pajamas.            11.  Place clean sheets on your bed the night of your first shower and do not  sleep with pets. Day of Surgery : Do not apply any lotions/deodorants the morning of surgery.  Please wear clean clothes to the hospital/surgery center.  FAILURE TO FOLLOW THESE INSTRUCTIONS MAY RESULT IN THE CANCELLATION OF YOUR SURGERY PATIENT SIGNATURE_________________________________  NURSE SIGNATURE__________________________________  ________________________________________________________________________    CLEAR LIQUID DIET  TILL 0730 AM THEN NOTHING BY MOUTH   Foods Allowed                                                                     Foods Excluded  Coffee and tea, regular and decaf                             liquids that you cannot  Plain Jell-O in any flavor                                             see through such as: Fruit ices (not  with fruit pulp)                                     milk, soups, orange juice  Iced Popsicles                                    All solid food Carbonated beverages, regular and diet                                    Cranberry, grape and apple juices Sports drinks like Gatorade Lightly seasoned clear broth or consume(fat free) Sugar, honey syrup  Sample Menu Breakfast                                Lunch  Supper Cranberry juice                    Beef broth                            Chicken broth Jell-O                                     Grape juice                           Apple juice Coffee or tea                        Jell-O                                      Popsicle                                                Coffee or tea                        Coffee or tea  _____________________________________________________________________   WHAT IS A BLOOD TRANSFUSION? Blood Transfusion Information  A transfusion is the replacement of blood or some of its parts. Blood is made up of multiple cells which provide different functions.  Red blood cells carry oxygen and are used for blood loss replacement.  White blood cells fight against infection.  Platelets control bleeding.  Plasma helps clot blood.  Other blood products are available for specialized needs, such as hemophilia or other clotting disorders. BEFORE THE TRANSFUSION  Who gives blood for transfusions?   Healthy volunteers who are fully evaluated to make sure their blood is safe. This is blood bank blood. Transfusion therapy is the safest it has ever been in the practice of medicine. Before blood is taken from a donor, a complete history is taken to make sure that person has no history of diseases nor engages in risky social behavior (examples are intravenous drug use or sexual activity with multiple partners). The donor's travel history is screened to minimize risk of transmitting  infections, such as malaria. The donated blood is tested for signs of infectious diseases, such as HIV and hepatitis. The blood is then tested to be sure it is compatible with you in order to minimize the chance of a transfusion reaction. If you or a relative donates blood, this is often done in anticipation of surgery and is not appropriate for emergency situations. It takes many days to process the donated blood. RISKS AND COMPLICATIONS Although transfusion therapy is very safe and saves many lives, the main dangers of transfusion include:   Getting an infectious disease.  Developing a transfusion reaction. This is an allergic reaction to something in the blood you were given. Every precaution is taken to prevent this. The decision to have a blood transfusion has been considered carefully by your caregiver before blood is given. Blood is not given unless the benefits outweigh the risks. AFTER THE TRANSFUSION  Right after receiving a blood transfusion, you will usually  feel much better and more energetic. This is especially true if your red blood cells have gotten low (anemic). The transfusion raises the level of the red blood cells which carry oxygen, and this usually causes an energy increase.  The nurse administering the transfusion will monitor you carefully for complications. HOME CARE INSTRUCTIONS  No special instructions are needed after a transfusion. You may find your energy is better. Speak with your caregiver about any limitations on activity for underlying diseases you may have. SEEK MEDICAL CARE IF:   Your condition is not improving after your transfusion.  You develop redness or irritation at the intravenous (IV) site. SEEK IMMEDIATE MEDICAL CARE IF:  Any of the following symptoms occur over the next 12 hours:  Shaking chills.  You have a temperature by mouth above 102 F (38.9 C), not controlled by medicine.  Chest, back, or muscle pain.  People around you feel you are  not acting correctly or are confused.  Shortness of breath or difficulty breathing.  Dizziness and fainting.  You get a rash or develop hives.  You have a decrease in urine output.  Your urine turns a dark color or changes to pink, red, or brown. Any of the following symptoms occur over the next 10 days:  You have a temperature by mouth above 102 F (38.9 C), not controlled by medicine.  Shortness of breath.  Weakness after normal activity.  The white part of the eye turns yellow (jaundice).  You have a decrease in the amount of urine or are urinating less often.  Your urine turns a dark color or changes to pink, red, or brown. Document Released: 03/27/2000 Document Revised: 06/22/2011 Document Reviewed: 11/14/2007 Exit[employer] Patient Information 2014 [place].  _______________________________________________________________________  Incentive Spirometer  An incentive spirometer is a tool that can help keep your lungs clear and active. This tool measures how well you are filling your lungs with each breath. Taking long deep breaths may help reverse or decrease the chance of developing breathing (pulmonary) problems (especially infection) following:  A long period of time when you are unable to move or be active. BEFORE THE PROCEDURE   If the spirometer includes an indicator to show your best effort, your nurse or respiratory therapist will set it to a desired goal.  If possible, sit up straight or lean slightly forward. Try not to slouch.  Hold the incentive spirometer in an upright position. INSTRUCTIONS FOR USE  1. Sit on the edge of your bed if possible, or sit up as far as you can in bed or on a chair. 2. Hold the incentive spirometer in an upright position. 3. Breathe out normally. 4. Place the mouthpiece in your mouth and seal your lips tightly around it. 5. Breathe in slowly and as deeply as possible, raising the piston or the ball toward the top of the  column. 6. Hold your breath for 3-5 seconds or for as long as possible. Allow the piston or ball to fall to the bottom of the column. 7. Remove the mouthpiece from your mouth and breathe out normally. 8. Rest for a few seconds and repeat Steps 1 through 7 at least 10 times every 1-2 hours when you are awake. Take your time and take a few normal breaths between deep breaths. 9. The spirometer may include an indicator to show your best effort. Use the indicator as a goal to work toward during each repetition. 10. After each set of 10 deep breaths, practice coughing to  be sure your lungs are clear. If you have an incision (the cut made at the time of surgery), support your incision when coughing by placing a pillow or rolled up towels firmly against it. Once you are able to get out of bed, walk around indoors and cough well. You may stop using the incentive spirometer when instructed by your caregiver.  RISKS AND COMPLICATIONS  Take your time so you do not get dizzy or light-headed.  If you are in pain, you may need to take or ask for pain medication before doing incentive spirometry. It is harder to take a deep breath if you are having pain. AFTER USE  Rest and breathe slowly and easily.  It can be helpful to keep track of a log of your progress. Your caregiver can provide you with a simple table to help with this. If you are using the spirometer at home, follow these instructions: [place] IF:   You are having difficultly using the spirometer.  You have trouble using the spirometer as often as instructed.  Your pain medication is not giving enough relief while using the spirometer.  You develop fever of 100.5 F (38.1 C) or higher. SEEK IMMEDIATE MEDICAL CARE IF:   You cough up bloody sputum that had not been present before.  You develop fever of 102 F (38.9 C) or greater.  You develop worsening pain at or near the incision site. MAKE SURE YOU:   Understand these  instructions.  Will watch your condition.  Will get help right away if you are not doing well or get worse. Document Released: 08/10/2006 Document Revised: 06/22/2011 Document Reviewed: 10/11/2006 [facility] Patient Information 2014 [place], [place].   ________________________________________________________________________

## 2017-03-12 ENCOUNTER — Encounter (HOSPITAL_COMMUNITY): Payer: Self-pay

## 2017-03-12 ENCOUNTER — Other Ambulatory Visit: Payer: Self-pay

## 2017-03-12 ENCOUNTER — Encounter (HOSPITAL_COMMUNITY)
Admission: RE | Admit: 2017-03-12 | Discharge: 2017-03-12 | Disposition: A | Discharge status: Home / Self Care | Payer: Medicare Other | Source: Ambulatory Visit | Attending: Orthopedic Surgery | Admitting: Orthopedic Surgery

## 2017-03-12 DIAGNOSIS — Z7901 Long term (current) use of anticoagulants: Secondary | ICD-10-CM | POA: Diagnosis not present

## 2017-03-12 DIAGNOSIS — Z8546 Personal history of malignant neoplasm of prostate: Secondary | ICD-10-CM | POA: Diagnosis not present

## 2017-03-12 DIAGNOSIS — Z79899 Other long term (current) drug therapy: Secondary | ICD-10-CM | POA: Diagnosis not present

## 2017-03-12 DIAGNOSIS — K219 Gastro-esophageal reflux disease without esophagitis: Secondary | ICD-10-CM | POA: Diagnosis not present

## 2017-03-12 DIAGNOSIS — Z01818 Encounter for other preprocedural examination: Secondary | ICD-10-CM | POA: Insufficient documentation

## 2017-03-12 DIAGNOSIS — M1612 Unilateral primary osteoarthritis, left hip: Secondary | ICD-10-CM | POA: Diagnosis not present

## 2017-03-12 HISTORY — DX: Unspecified osteoarthritis, unspecified site: M19.90

## 2017-03-12 LAB — CBC
HCT: 42 % (ref 39.0–52.0)
HEMOGLOBIN: 14.3 g/dL (ref 13.0–17.0)
MCH: 32 pg (ref 26.0–34.0)
MCHC: 34 g/dL (ref 30.0–36.0)
MCV: 94 fL (ref 78.0–100.0)
PLATELETS: 269 10*3/uL (ref 150–400)
RBC: 4.47 MIL/uL (ref 4.22–5.81)
RDW: 12.8 % (ref 11.5–15.5)
WBC: 7.3 10*3/uL (ref 4.0–10.5)

## 2017-03-12 LAB — COMPREHENSIVE METABOLIC PANEL
ALBUMIN: 4.1 g/dL (ref 3.5–5.0)
ALK PHOS: 79 U/L (ref 38–126)
ALT: 16 U/L — ABNORMAL LOW (ref 17–63)
ANION GAP: 6 (ref 5–15)
AST: 23 U/L (ref 15–41)
BUN: 14 mg/dL (ref 6–20)
CALCIUM: 9.5 mg/dL (ref 8.9–10.3)
CHLORIDE: 101 mmol/L (ref 101–111)
CO2: 29 mmol/L (ref 22–32)
Creatinine, Ser: 0.85 mg/dL (ref 0.61–1.24)
GFR calc non Af Amer: >60 mL/min (ref >60)
GLUCOSE: 99 mg/dL (ref 65–99)
POTASSIUM: 4.4 mmol/L (ref 3.5–5.1)
SODIUM: 136 mmol/L (ref 135–145)
Total Bilirubin: 0.8 mg/dL (ref 0.3–1.2)
Total Protein: 7.3 g/dL (ref 6.5–8.1)

## 2017-03-12 LAB — PROTIME-INR
INR: 0.98
Prothrombin Time: 12.9 seconds (ref 11.4–15.2)

## 2017-03-12 LAB — SURGICAL PCR SCREEN
MRSA, PCR: NEGATIVE
STAPHYLOCOCCUS AUREUS: POSITIVE — AB

## 2017-03-12 LAB — APTT: APTT: 34 s (ref 24–36)

## 2017-03-13 LAB — ABO/RH: ABO/RH(D): A POS

## 2017-03-15 DIAGNOSIS — R269 Unspecified abnormalities of gait and mobility: Secondary | ICD-10-CM | POA: Diagnosis not present

## 2017-03-17 ENCOUNTER — Inpatient Hospital Stay (HOSPITAL_COMMUNITY): Payer: Medicare Other | Admitting: Anesthesiology

## 2017-03-17 ENCOUNTER — Other Ambulatory Visit: Payer: Self-pay

## 2017-03-17 ENCOUNTER — Inpatient Hospital Stay (HOSPITAL_COMMUNITY): Payer: Medicare Other

## 2017-03-17 ENCOUNTER — Inpatient Hospital Stay (HOSPITAL_COMMUNITY)
Admission: RE | Admit: 2017-03-17 | Discharge: 2017-03-18 | DRG: 470 | Disposition: A | Discharge status: Home Health | Payer: Medicare Other | Source: Ambulatory Visit | Attending: Orthopedic Surgery | Admitting: Orthopedic Surgery

## 2017-03-17 ENCOUNTER — Encounter (HOSPITAL_COMMUNITY)
Admission: RE | Disposition: A | Discharge status: Home Health | Payer: Self-pay | Source: Ambulatory Visit | Attending: Orthopedic Surgery

## 2017-03-17 ENCOUNTER — Encounter (HOSPITAL_COMMUNITY): Payer: Self-pay | Admitting: *Deleted

## 2017-03-17 DIAGNOSIS — Z419 Encounter for procedure for purposes other than remedying health state, unspecified: Secondary | ICD-10-CM

## 2017-03-17 DIAGNOSIS — M069 Rheumatoid arthritis, unspecified: Secondary | ICD-10-CM | POA: Diagnosis present

## 2017-03-17 DIAGNOSIS — M25552 Pain in left hip: Secondary | ICD-10-CM | POA: Diagnosis present

## 2017-03-17 DIAGNOSIS — R0600 Dyspnea, unspecified: Secondary | ICD-10-CM | POA: Diagnosis not present

## 2017-03-17 DIAGNOSIS — M169 Osteoarthritis of hip, unspecified: Secondary | ICD-10-CM | POA: Diagnosis present

## 2017-03-17 DIAGNOSIS — G4700 Insomnia, unspecified: Secondary | ICD-10-CM | POA: Diagnosis present

## 2017-03-17 DIAGNOSIS — Z8546 Personal history of malignant neoplasm of prostate: Secondary | ICD-10-CM

## 2017-03-17 DIAGNOSIS — E039 Hypothyroidism, unspecified: Secondary | ICD-10-CM | POA: Diagnosis not present

## 2017-03-17 DIAGNOSIS — Z96649 Presence of unspecified artificial hip joint: Secondary | ICD-10-CM

## 2017-03-17 DIAGNOSIS — M1612 Unilateral primary osteoarthritis, left hip: Secondary | ICD-10-CM | POA: Diagnosis not present

## 2017-03-17 DIAGNOSIS — F419 Anxiety disorder, unspecified: Secondary | ICD-10-CM | POA: Diagnosis present

## 2017-03-17 DIAGNOSIS — E785 Hyperlipidemia, unspecified: Secondary | ICD-10-CM | POA: Diagnosis not present

## 2017-03-17 DIAGNOSIS — Z96642 Presence of left artificial hip joint: Secondary | ICD-10-CM | POA: Diagnosis not present

## 2017-03-17 DIAGNOSIS — R269 Unspecified abnormalities of gait and mobility: Secondary | ICD-10-CM | POA: Diagnosis not present

## 2017-03-17 DIAGNOSIS — K219 Gastro-esophageal reflux disease without esophagitis: Secondary | ICD-10-CM | POA: Diagnosis not present

## 2017-03-17 DIAGNOSIS — C61 Malignant neoplasm of prostate: Secondary | ICD-10-CM | POA: Diagnosis not present

## 2017-03-17 DIAGNOSIS — Z471 Aftercare following joint replacement surgery: Secondary | ICD-10-CM | POA: Diagnosis not present

## 2017-03-17 HISTORY — PX: TOTAL HIP ARTHROPLASTY: SHX124

## 2017-03-17 LAB — TYPE AND SCREEN
ABO/RH(D): A POS
ANTIBODY SCREEN: NEGATIVE

## 2017-03-17 SURGERY — ARTHROPLASTY, HIP, TOTAL, ANTERIOR APPROACH
Anesthesia: Spinal | Site: Hip | Laterality: Left

## 2017-03-17 MED ORDER — OXYCODONE HCL 5 MG PO TABS
5.0000 mg | ORAL_TABLET | ORAL | Status: DC | PRN
  Administered 2017-03-17 – 2017-03-18 (×5): 5 mg via ORAL
  Filled 2017-03-17 (×5): qty 1

## 2017-03-17 MED ORDER — BUPIVACAINE HCL (PF) 0.25 % IJ SOLN
INTRAMUSCULAR | Status: DC | PRN
  Administered 2017-03-17: 30 mL

## 2017-03-17 MED ORDER — ACETAMINOPHEN 500 MG PO TABS
1000.0000 mg | ORAL_TABLET | Freq: Four times a day (QID) | ORAL | Status: AC
  Administered 2017-03-17 – 2017-03-18 (×4): 1000 mg via ORAL
  Filled 2017-03-17 (×4): qty 2

## 2017-03-17 MED ORDER — METOCLOPRAMIDE HCL 5 MG/ML IJ SOLN: 5.0000 mg | Freq: Three times a day (TID) | INTRAMUSCULAR | Status: DC | PRN

## 2017-03-17 MED ORDER — DEXAMETHASONE SODIUM PHOSPHATE 10 MG/ML IJ SOLN
10.0000 mg | Freq: Once | INTRAMUSCULAR | Status: AC
  Administered 2017-03-17: 10 mg via INTRAVENOUS

## 2017-03-17 MED ORDER — BUPIVACAINE HCL (PF) 0.5 % IJ SOLN
INTRAMUSCULAR | Status: DC | PRN
  Administered 2017-03-17: 15 mg via INTRATHECAL

## 2017-03-17 MED ORDER — PROPOFOL 10 MG/ML IV BOLUS
INTRAVENOUS | Status: AC
  Filled 2017-03-17: qty 40

## 2017-03-17 MED ORDER — TRANEXAMIC ACID 1000 MG/10ML IV SOLN
2000.0000 mg | Freq: Once | INTRAVENOUS | Status: DC
  Filled 2017-03-17: qty 20

## 2017-03-17 MED ORDER — BUPIVACAINE HCL (PF) 0.25 % IJ SOLN
INTRAMUSCULAR | Status: AC
  Filled 2017-03-17: qty 30

## 2017-03-17 MED ORDER — FENTANYL CITRATE (PF) 100 MCG/2ML IJ SOLN: 25.0000 ug | INTRAMUSCULAR | Status: DC | PRN

## 2017-03-17 MED ORDER — PROPOFOL 10 MG/ML IV BOLUS
INTRAVENOUS | Status: AC
  Filled 2017-03-17: qty 20

## 2017-03-17 MED ORDER — FENTANYL CITRATE (PF) 100 MCG/2ML IJ SOLN
INTRAMUSCULAR | Status: DC | PRN
  Administered 2017-03-17 (×2): 50 ug via INTRAVENOUS

## 2017-03-17 MED ORDER — FLEET ENEMA 7-19 GM/118ML RE ENEM: 1.0000 | ENEMA | Freq: Once | RECTAL | Status: DC | PRN

## 2017-03-17 MED ORDER — TRANEXAMIC ACID 1000 MG/10ML IV SOLN
INTRAVENOUS | Status: AC | PRN
  Administered 2017-03-17: 2000 mg via TOPICAL

## 2017-03-17 MED ORDER — ONDANSETRON HCL 4 MG/2ML IJ SOLN
INTRAMUSCULAR | Status: DC | PRN
  Administered 2017-03-17: 4 mg via INTRAVENOUS

## 2017-03-17 MED ORDER — 0.9 % SODIUM CHLORIDE (POUR BTL) OPTIME
TOPICAL | Status: DC | PRN
  Administered 2017-03-17: 1000 mL

## 2017-03-17 MED ORDER — BUPIVACAINE HCL (PF) 0.5 % IJ SOLN
INTRAMUSCULAR | Status: AC
  Filled 2017-03-17: qty 30

## 2017-03-17 MED ORDER — CEFAZOLIN SODIUM-DEXTROSE 2-4 GM/100ML-% IV SOLN
2.0000 g | INTRAVENOUS | Status: AC
  Administered 2017-03-17: 2 g via INTRAVENOUS

## 2017-03-17 MED ORDER — ACETAMINOPHEN 650 MG RE SUPP: 650.0000 mg | RECTAL | Status: DC | PRN

## 2017-03-17 MED ORDER — CEFAZOLIN SODIUM-DEXTROSE 2-4 GM/100ML-% IV SOLN
INTRAVENOUS | Status: AC
  Filled 2017-03-17: qty 100

## 2017-03-17 MED ORDER — MEPERIDINE HCL 50 MG/ML IJ SOLN: 6.2500 mg | INTRAMUSCULAR | Status: DC | PRN

## 2017-03-17 MED ORDER — RIVAROXABAN 10 MG PO TABS
10.0000 mg | ORAL_TABLET | Freq: Every day | ORAL | Status: DC
  Administered 2017-03-18: 10 mg via ORAL
  Filled 2017-03-17: qty 1

## 2017-03-17 MED ORDER — TRAMADOL HCL 50 MG PO TABS: 50.0000 mg | ORAL_TABLET | Freq: Four times a day (QID) | ORAL | Status: DC | PRN

## 2017-03-17 MED ORDER — BISACODYL 10 MG RE SUPP: 10.0000 mg | Freq: Every day | RECTAL | Status: DC | PRN

## 2017-03-17 MED ORDER — PROPOFOL 500 MG/50ML IV EMUL
INTRAVENOUS | Status: DC | PRN
  Administered 2017-03-17: 100 ug/kg/min via INTRAVENOUS

## 2017-03-17 MED ORDER — EPHEDRINE 5 MG/ML INJ
INTRAVENOUS | Status: AC
  Filled 2017-03-17: qty 10

## 2017-03-17 MED ORDER — LACTATED RINGERS IV SOLN: INTRAVENOUS | Status: DC

## 2017-03-17 MED ORDER — DEXAMETHASONE SODIUM PHOSPHATE 10 MG/ML IJ SOLN
10.0000 mg | Freq: Once | INTRAMUSCULAR | Status: AC
  Administered 2017-03-18: 08:00:00 10 mg via INTRAVENOUS
  Filled 2017-03-17: qty 1

## 2017-03-17 MED ORDER — DOCUSATE SODIUM 100 MG PO CAPS
100.0000 mg | ORAL_CAPSULE | Freq: Two times a day (BID) | ORAL | Status: DC
  Administered 2017-03-17 – 2017-03-18 (×2): 100 mg via ORAL
  Filled 2017-03-17 (×2): qty 1

## 2017-03-17 MED ORDER — ACETAMINOPHEN 10 MG/ML IV SOLN
INTRAVENOUS | Status: AC
  Filled 2017-03-17: qty 100

## 2017-03-17 MED ORDER — SENNOSIDES 25 MG PO TABS: 100.0000 mg | ORAL_TABLET | Freq: Every day | ORAL | Status: DC

## 2017-03-17 MED ORDER — ONDANSETRON HCL 4 MG PO TABS: 4.0000 mg | ORAL_TABLET | Freq: Four times a day (QID) | ORAL | Status: DC | PRN

## 2017-03-17 MED ORDER — PROMETHAZINE HCL 25 MG/ML IJ SOLN: 6.2500 mg | INTRAMUSCULAR | Status: DC | PRN

## 2017-03-17 MED ORDER — PHENYLEPHRINE HCL 10 MG/ML IJ SOLN
INTRAMUSCULAR | Status: AC
  Filled 2017-03-17: qty 1

## 2017-03-17 MED ORDER — SODIUM CHLORIDE 0.9 % IV SOLN
INTRAVENOUS | Status: DC
  Administered 2017-03-17: 19:00:00 via INTRAVENOUS

## 2017-03-17 MED ORDER — STERILE WATER FOR IRRIGATION IR SOLN
Status: DC | PRN
  Administered 2017-03-17: 2000 mL

## 2017-03-17 MED ORDER — LORAZEPAM 0.5 MG PO TABS
0.5000 mg | ORAL_TABLET | Freq: Every day | ORAL | Status: DC
  Administered 2017-03-17 – 2017-03-18 (×2): 0.5 mg via ORAL
  Filled 2017-03-17: qty 2

## 2017-03-17 MED ORDER — ONDANSETRON HCL 4 MG/2ML IJ SOLN: 4.0000 mg | Freq: Four times a day (QID) | INTRAMUSCULAR | Status: DC | PRN

## 2017-03-17 MED ORDER — DEXTROSE 5 % IV SOLN
INTRAVENOUS | Status: DC | PRN
  Administered 2017-03-17: 50 ug/min via INTRAVENOUS

## 2017-03-17 MED ORDER — METHOCARBAMOL 1000 MG/10ML IJ SOLN
500.0000 mg | Freq: Four times a day (QID) | INTRAMUSCULAR | Status: DC | PRN
  Administered 2017-03-17: 500 mg via INTRAVENOUS
  Filled 2017-03-17: qty 550

## 2017-03-17 MED ORDER — MENTHOL 3 MG MT LOZG: 1.0000 | LOZENGE | OROMUCOSAL | Status: DC | PRN

## 2017-03-17 MED ORDER — FENTANYL CITRATE (PF) 100 MCG/2ML IJ SOLN
INTRAMUSCULAR | Status: AC
  Filled 2017-03-17: qty 2

## 2017-03-17 MED ORDER — EPHEDRINE SULFATE 50 MG/ML IJ SOLN
INTRAMUSCULAR | Status: DC | PRN
  Administered 2017-03-17: 10 mg via INTRAVENOUS
  Administered 2017-03-17: 5 mg via INTRAVENOUS
  Administered 2017-03-17 (×3): 10 mg via INTRAVENOUS
  Administered 2017-03-17: 5 mg via INTRAVENOUS

## 2017-03-17 MED ORDER — DEXAMETHASONE SODIUM PHOSPHATE 10 MG/ML IJ SOLN
INTRAMUSCULAR | Status: AC
  Filled 2017-03-17: qty 1

## 2017-03-17 MED ORDER — PHENOL 1.4 % MT LIQD: 1.0000 | OROMUCOSAL | Status: DC | PRN

## 2017-03-17 MED ORDER — MORPHINE SULFATE (PF) 4 MG/ML IV SOLN: 1.0000 mg | INTRAVENOUS | Status: DC | PRN

## 2017-03-17 MED ORDER — ACETAMINOPHEN 325 MG PO TABS: 650.0000 mg | ORAL_TABLET | ORAL | Status: DC | PRN

## 2017-03-17 MED ORDER — METOCLOPRAMIDE HCL 5 MG PO TABS: 5.0000 mg | ORAL_TABLET | Freq: Three times a day (TID) | ORAL | Status: DC | PRN

## 2017-03-17 MED ORDER — LACTATED RINGERS IV SOLN
INTRAVENOUS | Status: DC
  Administered 2017-03-17 (×2): via INTRAVENOUS

## 2017-03-17 MED ORDER — DIPHENHYDRAMINE HCL 12.5 MG/5ML PO ELIX: 12.5000 mg | ORAL_SOLUTION | ORAL | Status: DC | PRN

## 2017-03-17 MED ORDER — POLYETHYLENE GLYCOL 3350 17 G PO PACK: 17.0000 g | PACK | Freq: Every day | ORAL | Status: DC | PRN

## 2017-03-17 MED ORDER — OXYCODONE HCL 5 MG PO TABS: 10.0000 mg | ORAL_TABLET | ORAL | Status: DC | PRN

## 2017-03-17 MED ORDER — CHLORHEXIDINE GLUCONATE 4 % EX LIQD: 60.0000 mL | Freq: Once | CUTANEOUS | Status: DC

## 2017-03-17 MED ORDER — ONDANSETRON HCL 4 MG/2ML IJ SOLN
INTRAMUSCULAR | Status: AC
  Filled 2017-03-17: qty 2

## 2017-03-17 MED ORDER — METHOCARBAMOL 500 MG PO TABS
500.0000 mg | ORAL_TABLET | Freq: Four times a day (QID) | ORAL | Status: DC | PRN
  Administered 2017-03-18 (×2): 500 mg via ORAL
  Filled 2017-03-17 (×2): qty 1

## 2017-03-17 MED ORDER — SENNA 8.6 MG PO TABS
2.0000 | ORAL_TABLET | Freq: Every day | ORAL | Status: DC
  Administered 2017-03-17: 17.2 mg via ORAL
  Filled 2017-03-17: qty 2

## 2017-03-17 MED ORDER — CEFAZOLIN SODIUM-DEXTROSE 2-4 GM/100ML-% IV SOLN
2.0000 g | Freq: Four times a day (QID) | INTRAVENOUS | Status: AC
  Administered 2017-03-17 – 2017-03-18 (×2): 2 g via INTRAVENOUS
  Filled 2017-03-17 (×2): qty 100

## 2017-03-17 MED ORDER — ACETAMINOPHEN 10 MG/ML IV SOLN
1000.0000 mg | Freq: Once | INTRAVENOUS | Status: AC
  Administered 2017-03-17: 1000 mg via INTRAVENOUS

## 2017-03-17 SURGICAL SUPPLY — 44 items
BAG DECANTER FOR FLEXI CONT (MISCELLANEOUS) ×3 IMPLANT
BAG SPEC THK2 15X12 ZIP CLS (MISCELLANEOUS) ×1
BAG ZIPLOCK 12X15 (MISCELLANEOUS) ×2 IMPLANT
BLADE SAG 18X100X1.27 (BLADE) ×3 IMPLANT
CAPT HIP TOTAL 2 ×2 IMPLANT
CATH COUDE 5CC RIBBED (CATHETERS) IMPLANT
CATH RIBBED COUDE 5CC (CATHETERS) ×3
CLOSURE WOUND 1/2 X4 (GAUZE/BANDAGES/DRESSINGS) ×2
CLOTH BEACON ORANGE TIMEOUT ST (SAFETY) ×3 IMPLANT
COVER PERINEAL POST (MISCELLANEOUS) ×3 IMPLANT
COVER SURGICAL LIGHT HANDLE (MISCELLANEOUS) ×3 IMPLANT
DECANTER SPIKE VIAL GLASS SM (MISCELLANEOUS) ×3 IMPLANT
DRAPE STERI IOBAN 125X83 (DRAPES) ×3 IMPLANT
DRAPE U-SHAPE 47X51 STRL (DRAPES) ×6 IMPLANT
DRSG ADAPTIC 3X8 NADH LF (GAUZE/BANDAGES/DRESSINGS) ×3 IMPLANT
DRSG MEPILEX BORDER 4X4 (GAUZE/BANDAGES/DRESSINGS) ×3 IMPLANT
DRSG MEPILEX BORDER 4X8 (GAUZE/BANDAGES/DRESSINGS) ×3 IMPLANT
DURAPREP 26ML APPLICATOR (WOUND CARE) ×3 IMPLANT
ELECT REM PT RETURN 15FT ADLT (MISCELLANEOUS) ×3 IMPLANT
EVACUATOR 1/8 PVC DRAIN (DRAIN) ×3 IMPLANT
GLOVE BIO SURGEON STRL SZ7.5 (GLOVE) ×3 IMPLANT
GLOVE BIO SURGEON STRL SZ8 (GLOVE) ×6 IMPLANT
GLOVE BIOGEL PI IND STRL 7.0 (GLOVE) IMPLANT
GLOVE BIOGEL PI IND STRL 7.5 (GLOVE) IMPLANT
GLOVE BIOGEL PI IND STRL 8 (GLOVE) ×2 IMPLANT
GLOVE BIOGEL PI INDICATOR 7.0 (GLOVE) ×4
GLOVE BIOGEL PI INDICATOR 7.5 (GLOVE) ×6
GLOVE BIOGEL PI INDICATOR 8 (GLOVE) ×4
GLOVE ECLIPSE 7.0 STRL STRAW (GLOVE) ×2 IMPLANT
GLOVE SURG SS PI 7.5 STRL IVOR (GLOVE) ×2 IMPLANT
GOWN STRL REUS W/ TWL XL LVL3 (GOWN DISPOSABLE) IMPLANT
GOWN STRL REUS W/TWL LRG LVL3 (GOWN DISPOSABLE) ×5 IMPLANT
GOWN STRL REUS W/TWL XL LVL3 (GOWN DISPOSABLE) ×8 IMPLANT
PACK ANTERIOR HIP CUSTOM (KITS) ×3 IMPLANT
STRIP CLOSURE SKIN 1/2X4 (GAUZE/BANDAGES/DRESSINGS) ×3 IMPLANT
SUT ETHIBOND NAB CT1 #1 30IN (SUTURE) ×3 IMPLANT
SUT MNCRL AB 4-0 PS2 18 (SUTURE) ×3 IMPLANT
SUT STRATAFIX 0 PDS 27 VIOLET (SUTURE) ×3
SUT VIC AB 2-0 CT1 27 (SUTURE) ×6
SUT VIC AB 2-0 CT1 TAPERPNT 27 (SUTURE) ×2 IMPLANT
SUTURE STRATFX 0 PDS 27 VIOLET (SUTURE) ×1 IMPLANT
SYR 50ML LL SCALE MARK (SYRINGE) ×2 IMPLANT
TRAY FOLEY W/METER SILVER 16FR (SET/KITS/TRAYS/PACK) ×3 IMPLANT
YANKAUER SUCT BULB TIP 10FT TU (MISCELLANEOUS) ×3 IMPLANT

## 2017-03-17 NOTE — Discharge Instructions (Addendum)
° °Dr. Frank Aluisio °Total Joint Specialist °[facility] °[address] °[place], [place] [address] °[phone] ° °ANTERIOR APPROACH TOTAL HIP REPLACEMENT POSTOPERATIVE DIRECTIONS ° ° °Hip Rehabilitation, Guidelines Following Surgery  °The results of a hip operation are greatly improved after range of motion and muscle strengthening exercises. Follow all safety measures which are given to protect your hip. If any of these exercises cause increased pain or swelling in your joint, decrease the amount until you are comfortable again. Then slowly increase the exercises. Call your caregiver if you have problems or questions.  ° °HOME CARE INSTRUCTIONS  °Remove items at home which could result in a fall. This includes throw rugs or furniture in walking pathways.  °· ICE to the affected hip every three hours for 30 minutes at a time and then as needed for pain and swelling.  Continue to use ice on the hip for pain and swelling from surgery. You may notice swelling that will progress down to the foot and ankle.  This is normal after surgery.  Elevate the leg when you are not up walking on it.   °· Continue to use the breathing machine which will help keep your temperature down.  It is common for your temperature to cycle up and down following surgery, especially at night when you are not up moving around and exerting yourself.  The breathing machine keeps your lungs expanded and your temperature down. ° ° °DIET °You may resume your previous home diet once your are discharged from the hospital. ° °DRESSING / WOUND CARE / SHOWERING °You may shower 3 days after surgery, but keep the wounds dry during showering.  You may use an occlusive plastic wrap (Press'n Seal for example), NO SOAKING/SUBMERGING IN THE BATHTUB.  If the bandage gets wet, change with a clean dry gauze.  If the incision gets wet, pat the wound dry with a clean towel. °You may start showering once you are discharged home but do not  submerge the incision under water. Just pat the incision dry and apply a dry gauze dressing on daily. °Change the surgical dressing daily and reapply a dry dressing each time. ° °ACTIVITY °Walk with your walker as instructed. °Use walker as long as suggested by your caregivers. °Avoid periods of inactivity such as sitting longer than an hour when not asleep. This helps prevent blood clots.  °You may resume a sexual relationship in one month or when given the OK by your doctor.  °You may return to work once you are cleared by your doctor.  °Do not drive a car for 6 weeks or until released by you surgeon.  °Do not drive while taking narcotics. ° °WEIGHT BEARING °Weight bearing as tolerated with assist device (walker, cane, etc) as directed, use it as long as suggested by your surgeon or therapist, typically at least 4-6 weeks. ° °POSTOPERATIVE CONSTIPATION PROTOCOL °Constipation - defined medically as fewer than three stools per week and severe constipation as less than one stool per week. ° °One of the most common issues patients have following surgery is constipation.  Even if you have a regular bowel pattern at home, your normal regimen is likely to be disrupted due to multiple reasons following surgery.  Combination of anesthesia, postoperative narcotics, change in appetite and fluid intake all can affect your bowels.  In order to avoid complications following surgery, here are some recommendations in order to help you during your recovery period. ° °Colace (docusate) - Pick up an over-the-counter   form of Colace or another stool softener and take twice a day as long as you are requiring postoperative pain medications.  Take with a full glass of water daily.  If you experience loose stools or diarrhea, hold the colace until you stool forms back up.  If your symptoms do not get better within 1 week or if they get worse, check with your doctor. ° °Dulcolax (bisacodyl) - Pick up over-the-counter and take as directed  by the product packaging as needed to assist with the movement of your bowels.  Take with a full glass of water.  Use this product as needed if not relieved by Colace only.  ° °MiraLax (polyethylene glycol) - Pick up over-the-counter to have on hand.  MiraLax is a solution that will increase the amount of water in your bowels to assist with bowel movements.  Take as directed and can mix with a glass of water, juice, soda, coffee, or tea.  Take if you go more than two days without a movement. °Do not use MiraLax more than once per day. Call your doctor if you are still constipated or irregular after using this medication for 7 days in a row. ° °If you continue to have problems with postoperative constipation, please contact the office for further assistance and recommendations.  If you experience "the worst abdominal pain ever" or develop nausea or vomiting, please contact the office immediatly for further recommendations for treatment. ° °ITCHING ° If you experience itching with your medications, try taking only a single pain pill, or even half a pain pill at a time.  You can also use Benadryl over the counter for itching or also to help with sleep.  ° °TED HOSE STOCKINGS °Wear the elastic stockings on both legs for three weeks following surgery during the day but you may remove then at night for sleeping. ° °MEDICATIONS °See your medication summary on the “After Visit Summary” that the nursing staff will review with you prior to discharge.  You may have some home medications which will be placed on hold until you complete the course of blood thinner medication.  It is important for you to complete the blood thinner medication as prescribed by your surgeon.  Continue your approved medications as instructed at time of discharge. ° °PRECAUTIONS °If you experience chest pain or shortness of breath - call 911 immediately for transfer to the hospital emergency department.  °If you develop a fever greater that 101 F,  purulent drainage from wound, increased redness or drainage from wound, foul odor from the wound/dressing, or calf pain - CONTACT YOUR SURGEON.   °                                                °FOLLOW-UP APPOINTMENTS °Make sure you keep all of your appointments after your operation with your surgeon and caregivers. You should call the office at the above phone number and make an appointment for approximately two weeks after the date of your surgery or on the date instructed by your surgeon outlined in the "After Visit Summary". ° °RANGE OF MOTION AND STRENGTHENING EXERCISES  °These exercises are designed to help you keep full movement of your hip joint. Follow your caregiver's or physical therapist's instructions. Perform all exercises about fifteen times, three times per day or as directed. Exercise both hips, even if you   have had only one joint replacement. These exercises can be done on a training (exercise) mat, on the floor, on a table or on a bed. Use whatever works the best and is most comfortable for you. Use music or television while you are exercising so that the exercises are a pleasant break in your day. This will make your life better with the exercises acting as a break in routine you can look forward to.  °Lying on your back, slowly slide your foot toward your buttocks, raising your knee up off the floor. Then slowly slide your foot back down until your leg is straight again.  °Lying on your back spread your legs as far apart as you can without causing discomfort.  °Lying on your side, raise your upper leg and foot straight up from the floor as far as is comfortable. Slowly lower the leg and repeat.  °Lying on your back, tighten up the muscle in the front of your thigh (quadriceps muscles). You can do this by keeping your leg straight and trying to raise your heel off the floor. This helps strengthen the largest muscle supporting your knee.  °Lying on your back, tighten up the muscles of your  buttocks both with the legs straight and with the knee bent at a comfortable angle while keeping your heel on the floor.  ° °IF YOU ARE TRANSFERRED TO A SKILLED REHAB FACILITY °If the patient is transferred to a skilled rehab facility following release from the hospital, a list of the current medications will be sent to the facility for the patient to continue.  When discharged from the skilled rehab facility, please have the facility set up the patient's Home Health Physical Therapy prior to being released. Also, the skilled facility will be responsible for providing the patient with their medications at time of release from the facility to include their pain medication, the muscle relaxants, and their blood thinner medication. If the patient is still at the rehab facility at time of the two week follow up appointment, the skilled rehab facility will also need to assist the patient in arranging follow up appointment in our office and any transportation needs. ° °MAKE SURE YOU:  °Understand these instructions.  °Get help right away if you are not doing well or get worse.  ° ° °Pick up stool softner and laxative for home use following surgery while on pain medications. °Do not submerge incision under water. °Please use good hand washing techniques while changing dressing each day. °May shower starting three days after surgery. °Please use a clean towel to pat the incision dry following showers. °Continue to use ice for pain and swelling after surgery. °Do not use any lotions or creams on the incision until instructed by your surgeon. ° °Take Xarelto for two and a half more weeks following discharge from the hospital, then discontinue Xarelto. °Once the patient has completed the blood thinner regimen, then take a Baby 81 mg Aspirin daily for three more weeks. ° ° ° ° °Information on my medicine - XARELTO® (Rivaroxaban) ° °Why was Xarelto® prescribed for you? °Xarelto® was prescribed for you to reduce the risk of blood  clots forming after orthopedic surgery. The medical term for these abnormal blood clots is venous thromboembolism (VTE). ° °What do you need to know about xarelto® ? °Take your Xarelto® ONCE DAILY at the same time every day. °You may take it either with or without food. ° °If you have difficulty swallowing the tablet whole, you may   crush it and mix in applesauce just prior to taking your dose. ° °Take Xarelto® exactly as prescribed by your doctor and DO NOT stop taking Xarelto® without talking to the doctor who prescribed the medication.  Stopping without other VTE prevention medication to take the place of Xarelto® may increase your risk of developing a clot. ° °After discharge, you should have regular check-up appointments with your healthcare provider that is prescribing your Xarelto®.   ° °What do you do if you miss a dose? °If you miss a dose, take it as soon as you remember on the same day then continue your regularly scheduled once daily regimen the next day. Do not take two doses of Xarelto® on the same day.  ° °Important Safety Information °A possible side effect of Xarelto® is bleeding. You should call your healthcare provider right away if you experience any of the following: °? Bleeding from an injury or your nose that does not stop. °? Unusual colored urine (red or dark brown) or unusual colored stools (red or black). °? Unusual bruising for unknown reasons. °? A serious fall or if you hit your head (even if there is no bleeding). ° °Some medicines may interact with Xarelto® and might increase your risk of bleeding while on Xarelto®. To help avoid this, consult your healthcare provider or pharmacist prior to using any new prescription or non-prescription medications, including herbals, vitamins, non-steroidal anti-inflammatory drugs (NSAIDs) and supplements. ° °This website has more information on Xarelto®: [email or web address].com. ° ° ° °

## 2017-03-17 NOTE — Anesthesia Postprocedure Evaluation (Signed)
Anesthesia Post Note  Patient: Stephen Mcclain  Procedure(s) Performed: LEFT TOTAL HIP ARTHROPLASTY ANTERIOR APPROACH (Left Hip)     Patient location during evaluation: PACU Anesthesia Type: Spinal Level of consciousness: oriented and awake and alert Pain management: pain level controlled Vital Signs Assessment: post-procedure vital signs reviewed and stable Respiratory status: spontaneous breathing, respiratory function stable and patient connected to nasal cannula oxygen Cardiovascular status: blood pressure returned to baseline and stable Postop Assessment: no headache, no backache, no apparent nausea or vomiting, spinal receding and patient able to bend at knees Anesthetic complications: no    Last Vitals:  Vitals:   03/17/17 1700 03/17/17 1800  BP: 134/78 131/78  Pulse: 71 72  Resp: 16 18  Temp: (!) 36.4 C (!) 36.4 C  SpO2: 100% 98%    Last Pain:  Vitals:   03/17/17 1800  TempSrc: Oral  PainSc:                  [name]

## 2017-03-17 NOTE — Anesthesia Procedure Notes (Signed)
Spinal  Patient location during procedure: OR Start time: 03/17/2017 12:47 PM End time: 03/17/2017 12:49 PM Staffing Resident/CRNA: Lind Covert, CRNA Preanesthetic Checklist Completed: patient identified, site marked, surgical consent, pre-op evaluation, timeout performed, IV checked, risks and benefits discussed and monitors and equipment checked Spinal Block Patient position: sitting Prep: Betadine Patient monitoring: cardiac monitor, continuous pulse ox, blood pressure and heart rate Approach: midline Location: L3-4 Injection technique: single-shot Needle Needle type: Pencan  Needle gauge: 24 G Needle length: 10 cm Needle insertion depth: 7 cm Assessment Sensory level: T6 Additional Notes Timeout performed. SAB kit date checked. SAB without difficulty

## 2017-03-17 NOTE — Transfer of Care (Signed)
Immediate Anesthesia Transfer of Care Note  Patient: Stephen Mcclain  Procedure(s) Performed: LEFT TOTAL HIP ARTHROPLASTY ANTERIOR APPROACH (Left Hip)  Patient Location: PACU  Anesthesia Type:Spinal  Level of Consciousness: sedated  Airway & Oxygen Therapy: Patient Spontanous Breathing and Patient connected to face mask oxygen  Post-op Assessment: Report given to RN and Post -op Vital signs reviewed and stable  Post vital signs: Reviewed and stable  Last Vitals:  Vitals:   03/17/17 1135  BP: (!) 150/97  Pulse: 73  Resp: 16  Temp: 36.6 C  SpO2: 96%    Last Pain:  Vitals:   03/17/17 1135  PainSc: 0-No pain         Complications: No apparent anesthesia complications

## 2017-03-17 NOTE — Op Note (Signed)
OPERATIVE REPORT- TOTAL HIP ARTHROPLASTY   PREOPERATIVE DIAGNOSIS: Osteoarthritis of the Left hip.   POSTOPERATIVE DIAGNOSIS: Osteoarthritis of the Left  hip.   PROCEDURE: Left total hip arthroplasty, anterior approach.   SURGEON: Gaynelle Arabian, MD   ASSISTANT: Arlee Muslim, PA-C  ANESTHESIA:  Spinal  ESTIMATED BLOOD LOSS:-750 mL    DRAINS: Hemovac x1.   COMPLICATIONS: None   CONDITION: PACU - hemodynamically stable.   BRIEF CLINICAL NOTE: Stephen Mcclain is a 83 y.o. male who has advanced end-  stage arthritis of their Left  hip with progressively worsening pain and  dysfunction.The patient has failed nonoperative management and presents for  total hip arthroplasty.   PROCEDURE IN DETAIL: After successful administration of spinal  anesthetic, the traction boots for the [facility] bed were placed on both  feet and the patient was placed onto the [facility] bed, boots placed into the leg  holders. The Left hip was then isolated from the perineum with plastic  drapes and prepped and draped in the usual sterile fashion. ASIS and  greater trochanter were marked and a oblique incision was made, starting  at about 1 cm lateral and 2 cm distal to the ASIS and coursing towards  the anterior cortex of the femur. The skin was cut with a 10 blade  through subcutaneous tissue to the level of the fascia overlying the  tensor fascia lata muscle. The fascia was then incised in line with the  incision at the junction of the anterior third and posterior 2/3rd. The  muscle was teased off the fascia and then the interval between the TFL  and the rectus was developed. The Hohmann retractor was then placed at  the top of the femoral neck over the capsule. The vessels overlying the  capsule were cauterized and the fat on top of the capsule was removed.  A Hohmann retractor was then placed anterior underneath the rectus  femoris to give exposure to the entire anterior capsule. A T-shaped  capsulotomy  was performed. The edges were tagged and the femoral head  was identified.       Osteophytes are removed off the superior acetabulum.  The femoral neck was then cut in situ with an oscillating saw. Traction  was then applied to the left lower extremity utilizing the [facility]  traction. The femoral head was then removed. Retractors were placed  around the acetabulum and then circumferential removal of the labrum was  performed. Osteophytes were also removed. Reaming starts at 49 mm to  medialize and  Increased in 2 mm increments to 53 mm. We reamed in  approximately 40 degrees of abduction, 20 degrees anteversion. A 54 mm  pinnacle acetabular shell was then impacted in anatomic position under  fluoroscopic guidance with excellent purchase. We did not need to place  any additional dome screws. A 36 mm neutral + 4 marathon liner was then  placed into the acetabular shell.       The femoral lift was then placed along the lateral aspect of the femur  just distal to the vastus ridge. The leg was  externally rotated and capsule  was stripped off the inferior aspect of the femoral neck down to the  level of the lesser trochanter, this was done with electrocautery. The femur was lifted after this was performed. The  leg was then placed in an extended and adducted position essentially delivering the femur. We also removed the capsule superiorly and the piriformis from the piriformis  fossa to gain excellent exposure of the  proximal femur. Rongeur was used to remove some cancellous bone to get  into the lateral portion of the proximal femur for placement of the  initial starter reamer. The starter broaches was placed  the starter broach  and was shown to go down the center of the canal. Broaching  with the  Corail system was then performed starting at size 8, coursing  Up to size 14. A size 14 had excellent torsional and rotational  and axial stability. The trial high offset neck was then placed  with a 36  + 5 trial head. The hip was then reduced. We confirmed that  the stem was in the canal both on AP and lateral x-rays. It also has excellent sizing. The hip was reduced with outstanding stability through full extension and full external rotation.. AP pelvis was taken and the leg lengths were measured and found to be equal. Hip was then dislocated again and the femoral head and neck removed. The  femoral broach was removed. Size 14 Corail stem with a high offset  neck was then impacted into the femur following native anteversion. Has  excellent purchase in the canal. Excellent torsional and rotational and  axial stability. It is confirmed to be in the canal on AP and lateral  fluoroscopic views. The 36 + 5 ceramic head was placed and the hip  reduced with outstanding stability. Again AP pelvis was taken and it  confirmed that the leg lengths were equal. The wound was then copiously  irrigated with saline solution and the capsule reattached and repaired  with Ethibond suture. 30 ml of .25% Bupivicaine was  injected into the capsule and into the edge of the tensor fascia lata as well as subcutaneous tissue. The fascia overlying the tensor fascia lata was then closed with a running #1 V-Loc. Subcu was closed with interrupted 2-0 Vicryl and subcuticular running 4-0 Monocryl. Incision was cleaned  and dried. Steri-Strips and a bulky sterile dressing applied. Hemovac  drain was hooked to suction and then the patient was awakened and transported to  recovery in stable condition.        Please note that a surgical assistant was a medical necessity for this procedure to perform it in a safe and expeditious manner. Assistant was necessary to provide appropriate retraction of vital neurovascular structures and to prevent femoral fracture and allow for anatomic placement of the prosthesis.  Gaynelle Arabian, M.D.

## 2017-03-17 NOTE — Anesthesia Preprocedure Evaluation (Addendum)
Anesthesia Evaluation  Patient identified by MRN, date of birth, ID band Patient awake    Reviewed: Allergy & Precautions, NPO status , Patient's Chart, lab work & pertinent test results  Airway Mallampati: IV  TM Distance: >3 FB Neck ROM: Full  Mouth opening: Limited Mouth Opening  Dental  (+) Teeth Intact, Dental Advisory Given   Pulmonary neg pulmonary ROS,    breath sounds clear to auscultation       Cardiovascular + Valvular Problems/Murmurs  Rhythm:Regular Rate:Normal     Neuro/Psych negative neurological ROS  negative psych ROS   GI/Hepatic GERD  ,  Endo/Other    Renal/GU      Musculoskeletal  (+) Arthritis ,   Abdominal Normal abdominal exam  (+)   Peds  Hematology   Anesthesia Other Findings - HLD  Reproductive/Obstetrics                            Lab Results  Component Value Date   WBC 7.3 03/12/2017   HGB 14.3 03/12/2017   HCT 42.0 03/12/2017   MCV 94.0 03/12/2017   PLT 269 03/12/2017     Anesthesia Physical Anesthesia Plan  ASA: II  Anesthesia Plan: Spinal   Post-op Pain Management:    Induction: Intravenous  PONV Risk Score and Plan: 2 and Propofol infusion and Ondansetron  Airway Management Planned: Natural Airway and Nasal Cannula  Additional Equipment:   Intra-op Plan:   Post-operative Plan:   Informed Consent: I have reviewed the patients History and Physical, chart, labs and discussed the procedure including the risks, benefits and alternatives for the proposed anesthesia with the patient or authorized representative who has indicated his/her understanding and acceptance.   Dental advisory given  Plan Discussed with: CRNA  Anesthesia Plan Comments:         Anesthesia Quick Evaluation

## 2017-03-17 NOTE — Plan of Care (Signed)
df

## 2017-03-17 NOTE — Interval H&P Note (Signed)
History and Physical Interval Note:  03/17/2017 12:39 PM  Stephen Mcclain  has presented today for surgery, with the diagnosis of Osteoarthritis Left hip   The various methods of treatment have been discussed with the patient and family. After consideration of risks, benefits and other options for treatment, the patient has consented to  Procedure(s): LEFT TOTAL HIP ARTHROPLASTY ANTERIOR APPROACH (Left) as a surgical intervention .  The patient's history has been reviewed, patient examined, no change in status, stable for surgery.  I have reviewed the patient's chart and labs.  Questions were answered to the patient's satisfaction.     Pilar Plate Mellany Dinsmore

## 2017-03-18 LAB — CBC
HEMATOCRIT: 35.8 % — AB (ref 39.0–52.0)
Hemoglobin: 12.2 g/dL — ABNORMAL LOW (ref 13.0–17.0)
MCH: 31.6 pg (ref 26.0–34.0)
MCHC: 34.1 g/dL (ref 30.0–36.0)
MCV: 92.7 fL (ref 78.0–100.0)
Platelets: 248 10*3/uL (ref 150–400)
RBC: 3.86 MIL/uL — AB (ref 4.22–5.81)
RDW: 12.6 % (ref 11.5–15.5)
WBC: 13.5 10*3/uL — AB (ref 4.0–10.5)

## 2017-03-18 LAB — BASIC METABOLIC PANEL
ANION GAP: 7 (ref 5–15)
BUN: 12 mg/dL (ref 6–20)
CHLORIDE: 101 mmol/L (ref 101–111)
CO2: 24 mmol/L (ref 22–32)
Calcium: 8.4 mg/dL — ABNORMAL LOW (ref 8.9–10.3)
Creatinine, Ser: 0.66 mg/dL (ref 0.61–1.24)
GFR calc Af Amer: >60 mL/min (ref >60)
GFR calc non Af Amer: >60 mL/min (ref >60)
Glucose, Bld: 131 mg/dL — ABNORMAL HIGH (ref 65–99)
POTASSIUM: 4.1 mmol/L (ref 3.5–5.1)
Sodium: 132 mmol/L — ABNORMAL LOW (ref 135–145)

## 2017-03-18 MED ORDER — TRAMADOL HCL 50 MG PO TABS: 50.0000 mg | ORAL_TABLET | Freq: Four times a day (QID) | ORAL | Status: DC | PRN

## 2017-03-18 MED ORDER — RIVAROXABAN 10 MG PO TABS: 10.0000 mg | ORAL_TABLET | Freq: Every day | ORAL | 0 refills | Status: DC

## 2017-03-18 MED ORDER — OXYCODONE HCL 5 MG PO TABS: 5.0000 mg | ORAL_TABLET | ORAL | 0 refills | Status: DC | PRN

## 2017-03-18 MED ORDER — METHOCARBAMOL 500 MG PO TABS: 500.0000 mg | ORAL_TABLET | Freq: Four times a day (QID) | ORAL | 0 refills | Status: DC | PRN

## 2017-03-18 MED ORDER — TRAMADOL HCL 50 MG PO TABS: 50.0000 mg | ORAL_TABLET | Freq: Four times a day (QID) | ORAL | 0 refills | Status: DC | PRN

## 2017-03-18 NOTE — Discharge Summary (Signed)
Physician Discharge Summary   Patient ID: [name] MRN: [id number] DOB/AGE: 08/25/1930 83 y.o.  Admit date: 03/17/2017 Discharge date: 03-18-2017  Primary Diagnosis:  Osteoarthritis of the Left  hip.   Admission Diagnoses:  Past Medical History:  Diagnosis Date  . Adenomatous polyps 03/2001  . Allergy   . Arthritis   . BPH (benign prostatic hyperplasia)   . Chronic insomnia   . Chronic rhinitis   . Duodenitis   . ED (erectile dysfunction)   . Esophageal stricture   . Heart murmur    in his teens  . Hemorrhoids   . Hyperlipidemia   . Nonspecific elevation of levels of transaminase or lactic acid dehydrogenase (LDH)   . Prostate cancer [facility])    Discharge Diagnoses:   Principal Problem:   OA (osteoarthritis) of hip  Estimated body mass index is 25.46 kg/m as calculated from the following:   Height as of this encounter: 6' 1"  (1.854 m).   Weight as of this encounter: 87.5 kg (193 lb).  Procedure(s) (LRB): LEFT TOTAL HIP ARTHROPLASTY ANTERIOR APPROACH (Left)   Consults: None  HPI: Stephen Mcclain is a 83 y.o. male who has advanced end-  stage arthritis of their Left  hip with progressively worsening pain and  dysfunction.The patient has failed nonoperative management and presents for  total hip arthroplasty.    Laboratory Data: Admission on 03/17/2017  Component Date Value Ref Range Status  . WBC 03/18/2017 13.5* 4.0 - 10.5 K/uL Final  . RBC 03/18/2017 3.86* 4.22 - 5.81 MIL/uL Final  . Hemoglobin 03/18/2017 12.2* 13.0 - 17.0 g/dL Final  . HCT 03/18/2017 35.8* 39.0 - 52.0 % Final  . MCV 03/18/2017 92.7  78.0 - 100.0 fL Final  . MCH 03/18/2017 31.6  26.0 - 34.0 pg Final  . MCHC 03/18/2017 34.1  30.0 - 36.0 g/dL Final  . RDW 03/18/2017 12.6  11.5 - 15.5 % Final  . Platelets 03/18/2017 248  150 - 400 K/uL Final  . Sodium 03/18/2017 132* 135 - 145 mmol/L Final  . Potassium 03/18/2017 4.1  3.5 - 5.1 mmol/L Final  . Chloride 03/18/2017 101  101 - 111 mmol/L Final    . CO2 03/18/2017 24  22 - 32 mmol/L Final  . Glucose, Bld 03/18/2017 131* 65 - 99 mg/dL Final  . BUN 03/18/2017 12  6 - 20 mg/dL Final  . Creatinine, Ser 03/18/2017 0.66  0.61 - 1.24 mg/dL Final  . Calcium 03/18/2017 8.4* 8.9 - 10.3 mg/dL Final  . GFR calc non Af Amer 03/18/2017 >60  >60 mL/min Final  . GFR calc Af Amer 03/18/2017 >60  >60 mL/min Final   Comment: (NOTE) The eGFR has been calculated using the CKD EPI equation. This calculation has not been validated in all clinical situations. eGFR's persistently <60 mL/min signify possible Chronic Kidney Disease.   Georgiann Hahn gap 03/18/2017 7  5 - 15 Final  Hospital Outpatient Visit on 03/12/2017  Component Date Value Ref Range Status  . aPTT 03/12/2017 34  24 - 36 seconds Final  . WBC 03/12/2017 7.3  4.0 - 10.5 K/uL Final  . RBC 03/12/2017 4.47  4.22 - 5.81 MIL/uL Final  . Hemoglobin 03/12/2017 14.3  13.0 - 17.0 g/dL Final  . HCT 03/12/2017 42.0  39.0 - 52.0 % Final  . MCV 03/12/2017 94.0  78.0 - 100.0 fL Final  . MCH 03/12/2017 32.0  26.0 - 34.0 pg Final  . MCHC 03/12/2017 34.0  30.0 - 36.0 g/dL  Final  . RDW 03/12/2017 12.8  11.5 - 15.5 % Final  . Platelets 03/12/2017 269  150 - 400 K/uL Final  . Sodium 03/12/2017 136  135 - 145 mmol/L Final  . Potassium 03/12/2017 4.4  3.5 - 5.1 mmol/L Final  . Chloride 03/12/2017 101  101 - 111 mmol/L Final  . CO2 03/12/2017 29  22 - 32 mmol/L Final  . Glucose, Bld 03/12/2017 99  65 - 99 mg/dL Final  . BUN 03/12/2017 14  6 - 20 mg/dL Final  . Creatinine, Ser 03/12/2017 0.85  0.61 - 1.24 mg/dL Final  . Calcium 03/12/2017 9.5  8.9 - 10.3 mg/dL Final  . Total Protein 03/12/2017 7.3  6.5 - 8.1 g/dL Final  . Albumin 03/12/2017 4.1  3.5 - 5.0 g/dL Final  . AST 03/12/2017 23  15 - 41 U/L Final  . ALT 03/12/2017 16* 17 - 63 U/L Final  . Alkaline Phosphatase 03/12/2017 79  38 - 126 U/L Final  . Total Bilirubin 03/12/2017 0.8  0.3 - 1.2 mg/dL Final  . GFR calc non Af Amer 03/12/2017 >60  >60 mL/min  Final  . GFR calc Af Amer 03/12/2017 >60  >60 mL/min Final   Comment: (NOTE) The eGFR has been calculated using the CKD EPI equation. This calculation has not been validated in all clinical situations. eGFR's persistently <60 mL/min signify possible Chronic Kidney Disease.   . Anion gap 03/12/2017 6  5 - 15 Final  . Prothrombin Time 03/12/2017 12.9  11.4 - 15.2 seconds Final  . INR 03/12/2017 0.98   Final  . ABO/RH(D) 03/12/2017 A POS   Final  . Antibody Screen 03/12/2017 NEG   Final  . Sample Expiration 03/12/2017 03/20/2017   Final  . Extend sample reason 03/12/2017 NO TRANSFUSIONS OR PREGNANCY IN THE PAST 3 MONTHS   Final  . MRSA, PCR 03/12/2017 NEGATIVE  NEGATIVE Final  . Staphylococcus aureus 03/12/2017 POSITIVE* NEGATIVE Final   Comment: (NOTE) The Xpert SA Assay (FDA approved for NASAL specimens in patients 27 years of age and older), is one component of a comprehensive surveillance program. It is not intended to diagnose infection nor to guide or monitor treatment.   . ABO/RH(D) 03/12/2017 A POS   Final     X-Rays:Dg Pelvis Portable  Result Date: 03/17/2017 CLINICAL DATA:  83 year old male with a history of left hip replacement EXAM: PORTABLE PELVIS 1-2 VIEWS COMPARISON:  03/17/2017 FINDINGS: Surgical changes of left hip arthroplasty with surgical drain projecting in the soft tissues. No fracture identified. Urinary catheter projects over the pelvis. Surgical changes of prior brachy therapy of the prostate. Degenerative changes of the right hip. IMPRESSION: Early surgical changes of left hip arthroplasty with surgical drain in place. Urinary catheter projects over the pelvis Electronically Signed   By: Corrie Mckusick D.O.   On: 03/17/2017 15:05   Dg C-arm 1-60 Min-no Report  Result Date: 03/17/2017 Fluoroscopy was utilized by the requesting physician.  No radiographic interpretation.    EKG:No orders found for this or any previous visit.   Hospital Course: Patient was  admitted to [facility] and taken to the OR and underwent the above state procedure without complications.  Patient tolerated the procedure well and was later transferred to the recovery room and then to the orthopaedic floor for postoperative care.  They were given PO and IV analgesics for pain control following their surgery.  They were given 24 hours of postoperative antibiotics of  Anti-infectives (From admission, onward)  Start     Dose/Rate Route Frequency Ordered Stop   03/17/17 1930  ceFAZolin (ANCEF) IVPB 2g/100 mL premix     2 g 200 mL/hr over 30 Minutes Intravenous Every 6 hours 03/17/17 1656 03/18/17 0135   03/17/17 1118  ceFAZolin (ANCEF) 2-4 GM/100ML-% IVPB    Comments:  Algis Liming   : cabinet override      03/17/17 1118 03/17/17 1250   03/17/17 1113  ceFAZolin (ANCEF) IVPB 2g/100 mL premix     2 g 200 mL/hr over 30 Minutes Intravenous On call to O.R. 03/17/17 1113 03/17/17 1250     and started on DVT prophylaxis in the form of Xarelto.   PT and OT were ordered for total hip protocol.  The patient was allowed to be WBAT with therapy. Discharge planning was consulted to help with postop disposition and equipment needs.  Patient had a decent night on the evening of surgery.  They started to get up OOB with therapy on day one.  Hemovac drain was pulled without difficulty.  Dressing was okay.  Patient was seen in rounds by Dr. Wynelle Link and was ready to go home later that same day.Marland Kitchen   Discharge home with home health Diet - Clear liquid diet Follow up - in 2 weeks Activity - WBAT Disposition - Home Condition Upon Discharge - Stable D/C Meds - See DC Summary DVT Prophylaxis - Xarelto    Discharge Instructions    Call MD / Call 911   Complete by:  As directed    If you experience chest pain or shortness of breath, CALL 911 and be transported to the hospital emergency room.  If you develope a fever above 101 F, pus (white drainage) or increased drainage or redness at  the wound, or calf pain, call your surgeon's office.   Change dressing   Complete by:  As directed    You may change your dressing dressing daily with sterile 4 x 4 inch gauze dressing and paper tape.  Do not submerge the incision under water.   Constipation Prevention   Complete by:  As directed    Drink plenty of fluids.  Prune juice may be helpful.  You may use a stool softener, such as Colace (over the counter) 100 mg twice a day.  Use MiraLax (over the counter) for constipation as needed.   Diet - low sodium heart healthy   Complete by:  As directed    Discharge instructions   Complete by:  As directed    Take Xarelto for two and a half more weeks, then discontinue Xarelto. Once the patient has completed the blood thinner regimen, then take a Baby 81 mg Aspirin daily for three more weeks.  Pick up stool softner and laxative for home use following surgery while on pain medications. Do not submerge incision under water. Please use good hand washing techniques while changing dressing each day. May shower starting three days after surgery. Please use a clean towel to pat the incision dry following showers. Continue to use ice for pain and swelling after surgery. Do not use any lotions or creams on the incision until instructed by your surgeon.  Wear both TED hose on both legs during the day every day for three weeks, but may remove the TED hose at night at home.  Postoperative Constipation Protocol  Constipation - defined medically as fewer than three stools per week and severe constipation as less than one stool per week.  One of the most  common issues patients have following surgery is constipation.  Even if you have a regular bowel pattern at home, your normal regimen is likely to be disrupted due to multiple reasons following surgery.  Combination of anesthesia, postoperative narcotics, change in appetite and fluid intake all can affect your bowels.  In order to avoid complications  following surgery, here are some recommendations in order to help you during your recovery period.  Colace (docusate) - Pick up an over-the-counter form of Colace or another stool softener and take twice a day as long as you are requiring postoperative pain medications.  Take with a full glass of water daily.  If you experience loose stools or diarrhea, hold the colace until you stool forms back up.  If your symptoms do not get better within 1 week or if they get worse, check with your doctor.  Dulcolax (bisacodyl) - Pick up over-the-counter and take as directed by the product packaging as needed to assist with the movement of your bowels.  Take with a full glass of water.  Use this product as needed if not relieved by Colace only.   MiraLax (polyethylene glycol) - Pick up over-the-counter to have on hand.  MiraLax is a solution that will increase the amount of water in your bowels to assist with bowel movements.  Take as directed and can mix with a glass of water, juice, soda, coffee, or tea.  Take if you go more than two days without a movement. Do not use MiraLax more than once per day. Call your doctor if you are still constipated or irregular after using this medication for 7 days in a row.  If you continue to have problems with postoperative constipation, please contact the office for further assistance and recommendations.  If you experience "the worst abdominal pain ever" or develop nausea or vomiting, please contact the office immediatly for further recommendations for treatment.   Do not sit on low chairs, stoools or toilet seats, as it may be difficult to get up from low surfaces   Complete by:  As directed    Driving restrictions   Complete by:  As directed    No driving until released by the physician.   Increase activity slowly as tolerated   Complete by:  As directed    Lifting restrictions   Complete by:  As directed    No lifting until released by the physician.   Patient may  shower   Complete by:  As directed    You may shower without a dressing once there is no drainage.  Do not wash over the wound.  If drainage remains, do not shower until drainage stops.   TED hose   Complete by:  As directed    Use stockings (TED hose) for 3 weeks on both leg(s).  You may remove them at night for sleeping.   Weight bearing as tolerated   Complete by:  As directed    Laterality:  left   Extremity:  Lower     Allergies as of 03/18/2017   No Known Allergies     Medication List    STOP taking these medications   ASTAXANTHIN PO   CoQ-10 100 MG Caps   multivitamin tablet   NON FORMULARY   OMEGA 3 PO   Probiotic Caps   PROSTATE SUPPORT PO   VISION VITAMINS PO   Vitamin D3 5000 units Caps     TAKE these medications   LORazepam 1 MG tablet Commonly known  as:  ATIVAN Take 0.5-1 mg by mouth at bedtime.   methocarbamol 500 MG tablet Commonly known as:  ROBAXIN Take 1 tablet (500 mg total) by mouth every 6 (six) hours as needed for muscle spasms.   oxyCODONE 5 MG immediate release tablet Commonly known as:  Oxy IR/ROXICODONE Take 1-2 tablets (5-10 mg total) by mouth every 4 (four) hours as needed for moderate pain or severe pain.   rivaroxaban 10 MG Tabs tablet Commonly known as:  XARELTO Take 1 tablet (10 mg total) by mouth daily with breakfast. Take Xarelto for two and a half more weeks following discharge from the hospital, then discontinue Xarelto. Once the patient has completed the blood thinner regimen, then take a Baby 81 mg Aspirin daily for three more weeks.   Sennosides 25 MG Tabs Take 100 mg by mouth at bedtime.   traMADol 50 MG tablet Commonly known as:  ULTRAM Take 1-2 tablets (50-100 mg total) by mouth every 6 (six) hours as needed for moderate pain.   triamcinolone cream 0.1 % Commonly known as:  KENALOG Apply 1 application topically daily as needed (itching, rash).            Discharge Care Instructions  (From admission,  onward)        Start     Ordered   03/18/17 0000  Weight bearing as tolerated    Question Answer Comment  Laterality left   Extremity Lower      03/18/17 0801   03/18/17 0000  Change dressing    Comments:  You may change your dressing dressing daily with sterile 4 x 4 inch gauze dressing and paper tape.  Do not submerge the incision under water.   03/18/17 0801     Follow-up Information    Gaynelle Arabian, MD. Schedule an appointment as soon as possible for a visit on 03/30/2017.   Specialty:  Orthopedic Surgery Contact information: [address] [place] [address] [phone]           Signed: Arlee Muslim, PA-C Orthopaedic Surgery 03/18/2017, 8:03 AM

## 2017-03-18 NOTE — Plan of Care (Signed)
Plan of care reviewed with patient and wife.  Questions answered to their satisfaction.

## 2017-03-18 NOTE — Evaluation (Signed)
Physical Therapy Evaluation Patient Details Name: Stephen Mcclain MRN: [id number] DOB: Dec 18, 1930 Today's Date: 03/18/2017   History of Present Illness  Pt s/p L THR   Clinical Impression  Pt s/p L THR and presents with decreased L LE strength/ROM and post op pain limiting functional mobility.  Pt should progress to dc home with family assist.    Follow Up Recommendations Home health PT    Equipment Recommendations  Rolling walker with 5" wheels;3in1 (PT)    Recommendations for Other Services       Precautions / Restrictions Precautions Precautions: Fall Restrictions Weight Bearing Restrictions: No Other Position/Activity Restrictions: WBAT      Mobility  Bed Mobility Overal bed mobility: Needs Assistance Bed Mobility: Supine to Sit     Supine to sit: Min guard     General bed mobility comments: cues for sequence and use of R LE to self assist  Transfers Overall transfer level: Needs assistance Equipment used: Rolling walker (2 wheeled) Transfers: Sit to/from Stand Sit to Stand: Min assist;Min guard         General transfer comment: cues for LE management and use of UEs to self assist  Ambulation/Gait Ambulation/Gait assistance: Min assist;Min guard Ambulation Distance (Feet): 200 Feet Assistive device: Rolling walker (2 wheeled) Gait Pattern/deviations: Step-to pattern;Step-through pattern;Decreased step length - right;Decreased step length - left;Shuffle;Trunk flexed Gait velocity: decr Gait velocity interpretation: Below normal speed for age/gender General Gait Details: cues for sequence, posture and position from [employer]            Wheelchair Mobility    Modified Rankin (Stroke Patients Only)       Balance                                             Pertinent Vitals/Pain Pain Assessment: 0-10 Pain Score: 4  Pain Location: L hip Pain Descriptors / Indicators: Aching;Sore Pain Intervention(s): Limited activity  within patient's tolerance;Monitored during session;Premedicated before session;Ice applied    Home Living Family/patient expects to be discharged to:: Private residence Living Arrangements: Spouse/significant other Available Help at Discharge: Family;Available 24 hours/day Type of Home: House Home Access: Stairs to enter Entrance Stairs-Rails: None Entrance Stairs-Number of Steps: 2 Home Layout: One level Home Equipment: Cane - single point      Prior Function Level of Independence: Independent               Hand Dominance        Extremity/Trunk Assessment   Upper Extremity Assessment Upper Extremity Assessment: Overall WFL for tasks assessed    Lower Extremity Assessment Lower Extremity Assessment: LLE deficits/detail LLE Deficits / Details: Strength at hip 2+/5 with AAROM at hip to 90 flex and 25 abd       Communication   Communication: No difficulties  Cognition Arousal/Alertness: Awake/alert Behavior During Therapy: WFL for tasks assessed/performed Overall Cognitive Status: Within Functional Limits for tasks assessed                                        General Comments      Exercises Total Joint Exercises Ankle Circles/Pumps: AROM;Both;20 reps;Supine Quad Sets: AROM;Both;10 reps;Supine Heel Slides: AAROM;Left;20 reps;Supine Hip ABduction/ADduction: AAROM;Left;15 reps;Supine   Assessment/Plan    PT Assessment Patient needs continued PT services  PT Problem List Decreased strength;Decreased range of motion;Decreased activity tolerance;Decreased mobility;Pain;Decreased knowledge of use of DME       PT Treatment Interventions DME instruction;Gait training;Stair training;Functional mobility training;Therapeutic activities;Therapeutic exercise;Patient/family education    PT Goals (Current goals can be found in the Care Plan section)  Acute Rehab PT Goals Patient Stated Goal: Regain IND  PT Goal Formulation: With patient Time For  Goal Achievement: 03/21/17 Potential to Achieve Goals: Good    Frequency 7X/week   Barriers to discharge        Co-evaluation               AM-PAC PT "6 Clicks" Daily Activity  Outcome Measure Difficulty turning over in bed (including adjusting bedclothes, sheets and blankets)?: Unable Difficulty moving from lying on back to sitting on the side of the bed? : Unable Difficulty sitting down on and standing up from a chair with arms (e.g., wheelchair, bedside commode, etc,.)?: Unable Help needed moving to and from a bed to chair (including a wheelchair)?: A Little Help needed walking in hospital room?: A Little Help needed climbing 3-5 steps with a railing? : A Little 6 Click Score: 12    End of Session Equipment Utilized During Treatment: Gait belt Activity Tolerance: Patient tolerated treatment well Patient left: in chair;with call bell/phone within reach;with family/visitor present Nurse Communication: Mobility status PT Visit Diagnosis: Difficulty in walking, not elsewhere classified (R26.2)    Time: 5449-2010 PT Time Calculation (min) (ACUTE ONLY): 38 min   Charges:   PT Evaluation $PT Eval Low Complexity: 1 Low PT Treatments $Gait Training: 8-22 mins $Therapeutic Exercise: 8-22 mins   PT G Codes:        Pg [phone]   Taurus Willis 03/18/2017, 1:03 PM

## 2017-03-18 NOTE — Progress Notes (Signed)
Physical Therapy Treatment Patient Details Name: Stephen Mcclain MRN: [id number] DOB: [birth year] Today's Date: 03/18/2017    History of Present Illness Pt s/p L THR     PT Comments    Pt progressing well with mobility but continues impulsive and requiring cues to slow down for safety.  Caregiver present to review stairs, car transfers and therex.  Follow Up Recommendations  Home health PT     Equipment Recommendations  Rolling walker with 5" wheels;3in1 (PT)    Recommendations for Other Services       Precautions / Restrictions Precautions Precautions: Fall Restrictions Weight Bearing Restrictions: No Other Position/Activity Restrictions: WBAT    Mobility  Bed Mobility Overal bed mobility: Needs Assistance Bed Mobility: Supine to Sit;Sit to Supine     Supine to sit: Min guard Sit to supine: Min assist   General bed mobility comments: cues for sequence and use of R LE to self assist  Transfers Overall transfer level: Needs assistance Equipment used: Rolling walker (2 wheeled) Transfers: Sit to/from Stand Sit to Stand: Min guard;Supervision         General transfer comment: cues for LE management and use of UEs to self assist  Ambulation/Gait Ambulation/Gait assistance: Min guard;Supervision Ambulation Distance (Feet): 200 Feet Assistive device: Rolling walker (2 wheeled) Gait Pattern/deviations: Step-to pattern;Step-through pattern;Decreased step length - right;Decreased step length - left;Shuffle;Trunk flexed Gait velocity: decr Gait velocity interpretation: Below normal speed for age/gender General Gait Details: cues for sequence, posture and position from RW   Stairs Stairs: Yes   Stair Management: No rails;Forwards;With walker;Step to pattern Number of Stairs: 4 General stair comments: 2 stairs twice with RW at top of stairs.  Cues for sequence and foot/RW placement  Wheelchair Mobility    Modified Rankin (Stroke Patients Only)        Balance                                            Cognition Arousal/Alertness: Awake/alert Behavior During Therapy: WFL for tasks assessed/performed Overall Cognitive Status: Within Functional Limits for tasks assessed                                        Exercises Total Joint Exercises Ankle Circles/Pumps: AROM;Both;20 reps;Supine Quad Sets: AROM;Both;10 reps;Supine Heel Slides: AAROM;Left;20 reps;Supine Hip ABduction/ADduction: AAROM;Left;15 reps;Supine    General Comments        Pertinent Vitals/Pain Pain Assessment: 0-10 Pain Score: 4  Pain Location: L hip Pain Descriptors / Indicators: Aching;Sore Pain Intervention(s): Limited activity within patient's tolerance;Monitored during session;Premedicated before session;Ice applied    Home Living Family/patient expects to be discharged to:: Private residence Living Arrangements: Spouse/significant other Available Help at Discharge: Family;Available 24 hours/day Type of Home: House Home Access: Stairs to enter Entrance Stairs-Rails: None Home Layout: One level Home Equipment: Cane - single point      Prior Function Level of Independence: Independent          PT Goals (current goals can now be found in the care plan section) Acute Rehab PT Goals Patient Stated Goal: Regain IND  PT Goal Formulation: With patient Time For Goal Achievement: 03/21/17 Potential to Achieve Goals: Good Progress towards PT goals: Progressing toward goals    Frequency    7X/week  PT Plan Current plan remains appropriate    Co-evaluation              AM-PAC PT "6 Clicks" Daily Activity  Outcome Measure  Difficulty turning over in bed (including adjusting bedclothes, sheets and blankets)?: A Lot Difficulty moving from lying on back to sitting on the side of the bed? : A Lot Difficulty sitting down on and standing up from a chair with arms (e.g., wheelchair, bedside commode,  etc,.)?: A Little Help needed moving to and from a bed to chair (including a wheelchair)?: A Little Help needed walking in hospital room?: A Little Help needed climbing 3-5 steps with a railing? : A Little 6 Click Score: 16    End of Session Equipment Utilized During Treatment: Gait belt Activity Tolerance: Patient tolerated treatment well Patient left: with call bell/phone within reach;with family/visitor present;in bed Nurse Communication: Mobility status PT Visit Diagnosis: Difficulty in walking, not elsewhere classified (R26.2)     Time: 1323-1350 PT Time Calculation (min) (ACUTE ONLY): 27 min  Charges:  $Gait Training: 8-22 mins $Therapeutic Exercise: 8-22 mins $Therapeutic Activity: 8-22 mins                    G Codes:       BS [phone]    Antonia Jicha 03/18/2017, 2:38 PM

## 2017-03-18 NOTE — Progress Notes (Signed)
   Subjective: 1 Day Post-Op Procedure(s) (LRB): LEFT TOTAL HIP ARTHROPLASTY ANTERIOR APPROACH (Left) Patient reports pain as mild.   Patient seen in rounds by Dr. Wynelle Link. Patient is well, but has had some minor complaints of pain in the hip, requiring pain medications We will start therapy today.  If they do well with therapy and meets all goals, then will allow home later this afternoon following therapy. Plan is to go Home after hospital stay.  Objective: Vital signs in last 24 hours: Temp:  [97.2 F (36.2 C)-98.2 F (36.8 C)] 97.8 F (36.6 C) (12/06 0520) Pulse Rate:  [58-94] 65 (12/06 0520) Resp:  [11-19] 16 (12/06 0520) BP: (100-150)/(51-97) 123/74 (12/06 0520) SpO2:  [96 %-100 %] 97 % (12/06 0520) Weight:  [87.5 kg (193 lb)] 87.5 kg (193 lb) (12/05 1700)  Intake/Output from previous day:  Intake/Output Summary (Last 24 hours) at 03/18/2017 0750 Last data filed at 03/18/2017 0600 Gross per 24 hour  Intake 3226.83 ml  Output 3660 ml  Net -433.17 ml    Intake/Output this shift: No intake/output data recorded.  Labs: Recent Labs    03/18/17 0503  HGB 12.2*   Recent Labs    03/18/17 0503  WBC 13.5*  RBC 3.86*  HCT 35.8*  PLT 248   Recent Labs    03/18/17 0503  NA 132*  K 4.1  CL 101  CO2 24  BUN 12  CREATININE 0.66  GLUCOSE 131*  CALCIUM 8.4*   No results for input(s): LABPT, INR in the last 72 hours.  EXAM General - Patient is Alert, Appropriate and Oriented Extremity - Neurovascular intact Sensation intact distally Intact pulses distally Dorsiflexion/Plantar flexion intact Dressing - dressing C/D/I Motor Function - intact, moving foot and toes well on exam.  Hemovac pulled without difficulty.  Past Medical History:  Diagnosis Date  . Adenomatous polyps 03/2001  . Allergy   . Arthritis   . BPH (benign prostatic hyperplasia)   . Chronic insomnia   . Chronic rhinitis   . Duodenitis   . ED (erectile dysfunction)   . Esophageal stricture    . Heart murmur    in his teens  . Hemorrhoids   . Hyperlipidemia   . Nonspecific elevation of levels of transaminase or lactic acid dehydrogenase (LDH)   . Prostate cancer ([place])     Assessment/Plan: 1 Day Post-Op Procedure(s) (LRB): LEFT TOTAL HIP ARTHROPLASTY ANTERIOR APPROACH (Left) Principal Problem:   OA (osteoarthritis) of hip  Estimated body mass index is 25.46 kg/m as calculated from the following:   Height as of this encounter: 6\' 1"  (1.854 m).   Weight as of this encounter: 87.5 kg (193 lb). Up with therapy Discharge home with home health  DVT Prophylaxis - Xarelto Weight Bearing As Tolerated left Leg Hemovac Pulled Begin Therapy  If meets goals and able to go home: Discharge home with home health Diet - Clear liquid diet Follow up - in 2 weeks Activity - WBAT Disposition - Home Condition Upon Discharge - Stable D/C Meds - See DC Summary DVT Prophylaxis - Xarelto  Arlee Muslim, PA-C Orthopaedic Surgery 03/18/2017, 7:50 AM

## 2017-03-18 NOTE — Progress Notes (Signed)
Discharge planning, spoke with patient at bedside. Have chosen [facility] for [facility] PT. Contacted [facility] for referral. Needs RW and 3n1, contacted [facility] to deliver to room. [phone]

## 2017-03-19 ENCOUNTER — Observation Stay (HOSPITAL_COMMUNITY)
Admission: EM | Admit: 2017-03-19 | Discharge: 2017-03-20 | Disposition: A | Discharge status: Home / Self Care | Payer: Medicare Other | Attending: Internal Medicine | Admitting: Internal Medicine

## 2017-03-19 ENCOUNTER — Other Ambulatory Visit: Payer: Self-pay

## 2017-03-19 ENCOUNTER — Encounter (HOSPITAL_COMMUNITY): Payer: Self-pay

## 2017-03-19 ENCOUNTER — Observation Stay (HOSPITAL_COMMUNITY): Payer: Medicare Other

## 2017-03-19 DIAGNOSIS — Z8546 Personal history of malignant neoplasm of prostate: Secondary | ICD-10-CM | POA: Insufficient documentation

## 2017-03-19 DIAGNOSIS — R404 Transient alteration of awareness: Secondary | ICD-10-CM | POA: Diagnosis not present

## 2017-03-19 DIAGNOSIS — R42 Dizziness and giddiness: Secondary | ICD-10-CM | POA: Diagnosis not present

## 2017-03-19 DIAGNOSIS — K5900 Constipation, unspecified: Secondary | ICD-10-CM | POA: Insufficient documentation

## 2017-03-19 DIAGNOSIS — C61 Malignant neoplasm of prostate: Secondary | ICD-10-CM

## 2017-03-19 DIAGNOSIS — Z96642 Presence of left artificial hip joint: Secondary | ICD-10-CM | POA: Insufficient documentation

## 2017-03-19 DIAGNOSIS — R55 Syncope and collapse: Secondary | ICD-10-CM | POA: Diagnosis not present

## 2017-03-19 DIAGNOSIS — E860 Dehydration: Secondary | ICD-10-CM | POA: Diagnosis not present

## 2017-03-19 DIAGNOSIS — M169 Osteoarthritis of hip, unspecified: Secondary | ICD-10-CM | POA: Diagnosis present

## 2017-03-19 DIAGNOSIS — M1612 Unilateral primary osteoarthritis, left hip: Secondary | ICD-10-CM | POA: Diagnosis not present

## 2017-03-19 LAB — CBC WITH DIFFERENTIAL/PLATELET
BASOS PCT: 0 %
Basophils Absolute: 0 10*3/uL (ref 0.0–0.1)
Eosinophils Absolute: 0 10*3/uL (ref 0.0–0.7)
Eosinophils Relative: 0 %
HEMATOCRIT: 35.8 % — AB (ref 39.0–52.0)
HEMOGLOBIN: 12.2 g/dL — AB (ref 13.0–17.0)
Lymphocytes Relative: 6 %
Lymphs Abs: 0.9 10*3/uL (ref 0.7–4.0)
MCH: 32 pg (ref 26.0–34.0)
MCHC: 34.1 g/dL (ref 30.0–36.0)
MCV: 94 fL (ref 78.0–100.0)
MONOS PCT: 10 %
Monocytes Absolute: 1.6 10*3/uL — ABNORMAL HIGH (ref 0.1–1.0)
NEUTROS ABS: 13.7 10*3/uL — AB (ref 1.7–7.7)
NEUTROS PCT: 84 %
Platelets: 243 10*3/uL (ref 150–400)
RBC: 3.81 MIL/uL — AB (ref 4.22–5.81)
RDW: 13 % (ref 11.5–15.5)
WBC: 16.2 10*3/uL — ABNORMAL HIGH (ref 4.0–10.5)

## 2017-03-19 LAB — BASIC METABOLIC PANEL
ANION GAP: 5 (ref 5–15)
BUN: 19 mg/dL (ref 6–20)
CHLORIDE: 104 mmol/L (ref 101–111)
CO2: 27 mmol/L (ref 22–32)
Calcium: 8.5 mg/dL — ABNORMAL LOW (ref 8.9–10.3)
Creatinine, Ser: 0.91 mg/dL (ref 0.61–1.24)
GFR calc non Af Amer: >60 mL/min (ref >60)
Glucose, Bld: 108 mg/dL — ABNORMAL HIGH (ref 65–99)
Potassium: 3.8 mmol/L (ref 3.5–5.1)
Sodium: 136 mmol/L (ref 135–145)

## 2017-03-19 LAB — I-STAT TROPONIN, ED: Troponin i, poc: 0.01 ng/mL (ref 0.00–0.08)

## 2017-03-19 MED ORDER — ENOXAPARIN SODIUM 40 MG/0.4ML ~~LOC~~ SOLN: 40.0000 mg | SUBCUTANEOUS | Status: DC

## 2017-03-19 MED ORDER — SODIUM CHLORIDE 0.9% FLUSH
3.0000 mL | Freq: Two times a day (BID) | INTRAVENOUS | Status: DC
  Administered 2017-03-19 (×2): 3 mL via INTRAVENOUS

## 2017-03-19 MED ORDER — ACETAMINOPHEN 650 MG RE SUPP: 650.0000 mg | Freq: Four times a day (QID) | RECTAL | Status: DC | PRN

## 2017-03-19 MED ORDER — POLYETHYLENE GLYCOL 3350 17 G PO PACK: 17.0000 g | PACK | Freq: Every day | ORAL | Status: DC | PRN

## 2017-03-19 MED ORDER — RIVAROXABAN 10 MG PO TABS
10.0000 mg | ORAL_TABLET | Freq: Every day | ORAL | Status: DC
  Administered 2017-03-19 – 2017-03-20 (×2): 10 mg via ORAL
  Filled 2017-03-19 (×3): qty 1

## 2017-03-19 MED ORDER — MORPHINE SULFATE (PF) 4 MG/ML IV SOLN: 1.0000 mg | INTRAVENOUS | Status: DC | PRN

## 2017-03-19 MED ORDER — OXYCODONE HCL 5 MG PO TABS
5.0000 mg | ORAL_TABLET | ORAL | Status: DC | PRN
  Administered 2017-03-19: 5 mg via ORAL
  Filled 2017-03-19 (×2): qty 1

## 2017-03-19 MED ORDER — METHOCARBAMOL 500 MG PO TABS: 500.0000 mg | ORAL_TABLET | Freq: Four times a day (QID) | ORAL | Status: DC | PRN

## 2017-03-19 MED ORDER — SODIUM CHLORIDE 0.9 % IV SOLN
INTRAVENOUS | Status: DC
  Administered 2017-03-19: 17:00:00 via INTRAVENOUS

## 2017-03-19 MED ORDER — ACETAMINOPHEN 325 MG PO TABS
650.0000 mg | ORAL_TABLET | Freq: Four times a day (QID) | ORAL | Status: DC | PRN
  Administered 2017-03-19 – 2017-03-20 (×3): 650 mg via ORAL
  Filled 2017-03-19 (×3): qty 2

## 2017-03-19 MED ORDER — MAGNESIUM CITRATE PO SOLN
1.0000 | Freq: Once | ORAL | Status: AC
  Administered 2017-03-19: 1 via ORAL
  Filled 2017-03-19 (×2): qty 296

## 2017-03-19 NOTE — ED Provider Notes (Addendum)
[place] DEPT Provider Note   CSN: [id number] Arrival date & time: 03/19/17  1050     History   Chief Complaint Chief Complaint  Patient presents with  . Loss of Consciousness    HPI Stephen Mcclain is a 83 y.o. male.  She is obtained from patient and from patient's fianc.  Patient became lightheaded this morning and had to brief syncopal events lasting several seconds each.  He does not feel lightheaded on standing.  He states he felt "bad and lightheaded immediately prior to the syncopal event.  EMS treated patient with 500 mL of normal saline prior to coming here.  Patient is status post left total hip arthroplasty done electively for osteoarthritis discharged from hospital yesterday.  Other associated symptoms include constipation last bowel movement was yesterday no blood per rectum no black stools.  He denies abdominal pain denies chest pain denies headache.  He does complain of some pain at surgical site  HPI  Past Medical History:  Diagnosis Date  . Adenomatous polyps 03/2001  . Allergy   . Arthritis   . BPH (benign prostatic hyperplasia)   . Chronic insomnia   . Chronic rhinitis   . Duodenitis   . ED (erectile dysfunction)   . Esophageal stricture   . Heart murmur    in his teens  . Hemorrhoids   . Hyperlipidemia   . Nonspecific elevation of levels of transaminase or lactic acid dehydrogenase (LDH)   . Prostate cancer [facility])     Patient Active Problem List   Diagnosis Date Noted  . OA (osteoarthritis) of hip 03/17/2017  . Travel advice encounter 11/11/2012  . GERD 04/21/2010  . NONSPEC ELEVATION OF LEVELS OF TRANSAMINASE/LDH 04/21/2010  . PERSONAL HX COLONIC POLYPS 04/21/2010  . PROSTATE CANCER 05/16/2009  . CHRONIC RHINITIS 05/16/2009  . DYSPNEA 05/16/2009    Past Surgical History:  Procedure Laterality Date  . CATARACT EXTRACTION     lens implants  . COLONOSCOPY    . DERMOID CYST  EXCISION     arm  . HEMORRHOID  SURGERY    . NASAL SINUS SURGERY     2013  . POLYPECTOMY    . RADIOACTIVE SEED IMPLANT  1999  . TONSILLECTOMY AND ADENOIDECTOMY    . TOTAL HIP ARTHROPLASTY Left 03/17/2017   Procedure: LEFT TOTAL HIP ARTHROPLASTY ANTERIOR APPROACH;  Surgeon: Gaynelle Arabian, MD;  Location: [facility] ORS;  Service: Orthopedics;  Laterality: Left;       Home Medications    Prior to Admission medications   Medication Sig Start Date End Date Taking? Authorizing Provider  LORazepam (ATIVAN) 1 MG tablet Take 0.5-1 mg by mouth at bedtime.    Yes [provider]  methocarbamol (ROBAXIN) 500 MG tablet Take 1 tablet (500 mg total) by mouth every 6 (six) hours as needed for muscle spasms. 03/18/17  Yes Perkins, Alexzandrew L, PA-C  oxyCODONE (OXY IR/ROXICODONE) 5 MG immediate release tablet Take 1-2 tablets (5-10 mg total) by mouth every 4 (four) hours as needed for moderate pain or severe pain. 03/18/17  Yes Perkins, Alexzandrew L, PA-C  rivaroxaban (XARELTO) 10 MG TABS tablet Take 1 tablet (10 mg total) by mouth daily with breakfast. Take Xarelto for two and a half more weeks following discharge from the hospital, then discontinue Xarelto. Once the patient has completed the blood thinner regimen, then take a Baby 81 mg Aspirin daily for three more weeks. 03/18/17  Yes Perkins, Alexzandrew L, PA-C  Sennosides 25 MG  TABS Take 100 mg by mouth at bedtime.   Yes [provider]  traMADol (ULTRAM) 50 MG tablet Take 1-2 tablets (50-100 mg total) by mouth every 6 (six) hours as needed for moderate pain. 03/18/17  Yes Perkins, Alexzandrew L, PA-C  triamcinolone cream (KENALOG) 0.1 % Apply 1 application topically daily as needed (itching, rash).   Yes [provider]    Family History Family History  Problem Relation Age of Onset  . Heart disease Father   . Colon polyps Mother        Brother, and Sister  . Colon cancer Neg Hx   . Esophageal cancer Neg Hx   . Stomach cancer Neg Hx   . Rectal cancer Neg  Hx     Social History Social History   Tobacco Use  . Smoking status: Never Smoker  . Smokeless tobacco: Never Used  Substance Use Topics  . Alcohol use: Yes    Alcohol/week: 0.6 oz    Types: 1 Glasses of wine per week    Comment: 2-3 a week   . Drug use: No     Allergies   Patient has no known allergies.   Review of Systems Review of Systems  Gastrointestinal: Positive for constipation.  Skin: Positive for wound.       Surgical wound at left hip  All other systems reviewed and are negative.    Physical Exam Updated Vital Signs BP 130/65   Pulse 67   Temp 97.6 F (36.4 C) (Oral)   Resp (!) 23   SpO2 100%   Physical Exam  Constitutional: He is oriented to person, place, and time. He appears well-developed and well-nourished.  HENT:  Head: Normocephalic and atraumatic.  Eyes: Conjunctivae are normal. Pupils are equal, round, and reactive to light.  Neck: Neck supple. No tracheal deviation present. No thyromegaly present.  Cardiovascular: Normal rate and regular rhythm.  No murmur heard. Pulmonary/Chest: Effort normal and breath sounds normal.  Abdominal: Soft. Bowel sounds are normal. He exhibits no distension. There is no tenderness.  Musculoskeletal: Normal range of motion. He exhibits no edema or tenderness.  Neurological: He is alert and oriented to person, place, and time. No cranial nerve deficit. Coordination normal.  Not lightheaded on standing  Skin: Skin is warm and dry. No rash noted.  Clean appearing surgical wound at left anterior hip.  No swelling or deformity  Psychiatric: He has a normal mood and affect.  Nursing note and vitals reviewed.    ED Treatments / Results  Labs (all labs ordered are listed, but only abnormal results are displayed) Labs Reviewed  CBC WITH DIFFERENTIAL/PLATELET - Abnormal; Notable for the following components:      Result Value   WBC 16.2 (*)    RBC 3.81 (*)    Hemoglobin 12.2 (*)    HCT 35.8 (*)    Neutro  Abs 13.7 (*)    Monocytes Absolute 1.6 (*)    All other components within normal limits  BASIC METABOLIC PANEL  I-STAT TROPONIN, ED    EKG  EKG Interpretation None       Radiology Dg Pelvis Portable  Result Date: 03/17/2017 CLINICAL DATA:  83 year old male with a history of left hip replacement EXAM: PORTABLE PELVIS 1-2 VIEWS COMPARISON:  03/17/2017 FINDINGS: Surgical changes of left hip arthroplasty with surgical drain projecting in the soft tissues. No fracture identified. Urinary catheter projects over the pelvis. Surgical changes of prior brachy therapy of the prostate. Degenerative changes of the  right hip. IMPRESSION: Early surgical changes of left hip arthroplasty with surgical drain in place. Urinary catheter projects over the pelvis Electronically Signed   By: Corrie Mckusick D.O.   On: 03/17/2017 15:05   Dg C-arm 1-60 Min-no Report  Result Date: 03/17/2017 Fluoroscopy was utilized by the requesting physician.  No radiographic interpretation.    Procedures Procedures (including critical care time)  Medications Ordered in ED Medications - No data to display  Results for orders placed or performed during the hospital encounter of 03/19/17  CBC with Differential/Platelet  Result Value Ref Range   WBC 16.2 (H) 4.0 - 10.5 K/uL   RBC 3.81 (L) 4.22 - 5.81 MIL/uL   Hemoglobin 12.2 (L) 13.0 - 17.0 g/dL   HCT 35.8 (L) 39.0 - 52.0 %   MCV 94.0 78.0 - 100.0 fL   MCH 32.0 26.0 - 34.0 pg   MCHC 34.1 30.0 - 36.0 g/dL   RDW 13.0 11.5 - 15.5 %   Platelets 243 150 - 400 K/uL   Neutrophils Relative % 84 %   Neutro Abs 13.7 (H) 1.7 - 7.7 K/uL   Lymphocytes Relative 6 %   Lymphs Abs 0.9 0.7 - 4.0 K/uL   Monocytes Relative 10 %   Monocytes Absolute 1.6 (H) 0.1 - 1.0 K/uL   Eosinophils Relative 0 %   Eosinophils Absolute 0.0 0.0 - 0.7 K/uL   Basophils Relative 0 %   Basophils Absolute 0.0 0.0 - 0.1 K/uL  Basic metabolic panel  Result Value Ref Range   Sodium 136 135 - 145 mmol/L    Potassium 3.8 3.5 - 5.1 mmol/L   Chloride 104 101 - 111 mmol/L   CO2 27 22 - 32 mmol/L   Glucose, Bld 108 (H) 65 - 99 mg/dL   BUN 19 6 - 20 mg/dL   Creatinine, Ser 0.91 0.61 - 1.24 mg/dL   Calcium 8.5 (L) 8.9 - 10.3 mg/dL   GFR calc non Af Amer >60 >60 mL/min   GFR calc Af Amer >60 >60 mL/min   Anion gap 5 5 - 15  I-stat troponin, ED  Result Value Ref Range   Troponin i, poc 0.01 0.00 - 0.08 ng/mL   Comment 3           Dg Pelvis Portable  Result Date: 03/17/2017 CLINICAL DATA:  83 year old male with a history of left hip replacement EXAM: PORTABLE PELVIS 1-2 VIEWS COMPARISON:  03/17/2017 FINDINGS: Surgical changes of left hip arthroplasty with surgical drain projecting in the soft tissues. No fracture identified. Urinary catheter projects over the pelvis. Surgical changes of prior brachy therapy of the prostate. Degenerative changes of the right hip. IMPRESSION: Early surgical changes of left hip arthroplasty with surgical drain in place. Urinary catheter projects over the pelvis Electronically Signed   By: Corrie Mckusick D.O.   On: 03/17/2017 15:05   Dg C-arm 1-60 Min-no Report  Result Date: 03/17/2017 Fluoroscopy was utilized by the requesting physician.  No radiographic interpretation.   Initial Impression / Assessment and Plan / ED Course  I have reviewed the triage vital signs and the nursing notes.  Pertinent labs & imaging results that were available during my care of the patient were reviewed by me and considered in my medical decision making (see chart for details).     I consulted Dr.Xu who will arrange forovernight stay  Final Clinical Impressions(s) / ED Diagnoses  Diagnosis syncope Final diagnoses:  None    ED Discharge Orders  None       Orlie Dakin, MD 03/19/17 [place], MD 03/19/17 [phone]

## 2017-03-19 NOTE — ED Notes (Signed)
Bed: WA07 Expected date:  Expected time:  Means of arrival:  Comments: 83 yo syncope

## 2017-03-19 NOTE — ED Notes (Signed)
Patient ambulatory with walker to restroom with stand by assistance without difficulty.

## 2017-03-19 NOTE — H&P (Signed)
History and Physical    Stephen Mcclain ZOX:[id number] DOB: 12-11-1930 DOA: 03/19/2017  PCP: Thressa Sheller, MD  Patient coming from: Home  Chief Complaint: Passed out  HPI: Stephen Mcclain is a 83 y.o. male with medical history significant of osteoarthritis of the hip, status post recent surgery for which patient was discharged 1 day prior to hospital admission, BPH, hyperlipidemia.  Patient states that after arriving home from the hospital, patient reported increased weakness over his baseline associated with feelings of "dizziness" that is made worse with standing up with walker.  On the morning of hospital admission, patient recalled taking "pain pill" with his breakfast and shortly after felt "dizzy"while in sitting position.  Home health staff was alerted and on standing, patient was noted to have syncopized for an unknown period of time.  There was no loss of bowel or bladder function.  Patient denied feeling nauseated or diaphoretic.  Patient's family member in the room.  However states that the patient appeared flushed.  Of note, patient does report constipation.  Last bowel movement was approximately 3 days prior to this hospital admission.  ED Course: In the emergency department, patient was found to have normal orthostatic vital signs, albeit patient was status post 500 cc normal saline bolus in route to the hospital.  Renal function was found to be unremarkable with creatinine of 0.91.  Hospital service consulted for consideration for admission.   Review of Systems:  Review of Systems  Constitutional: Positive for malaise/fatigue. Negative for chills, diaphoresis, fever and weight loss.  HENT: Negative for congestion, ear discharge, ear pain and tinnitus.   Eyes: Negative for double vision, photophobia and pain.  Respiratory: Negative for hemoptysis, sputum production and shortness of breath.   Cardiovascular: Negative for palpitations, orthopnea and claudication.  Gastrointestinal:  Positive for constipation. Negative for abdominal pain, nausea and vomiting.  Genitourinary: Negative for frequency, hematuria and urgency.  Musculoskeletal: Negative for back pain and neck pain.  Neurological: Positive for loss of consciousness and weakness. Negative for tingling, tremors, speech change, focal weakness, seizures and headaches.  Psychiatric/Behavioral: Negative for hallucinations and memory loss. The patient is not nervous/anxious.     Past Medical History:  Diagnosis Date  . Adenomatous polyps 03/2001  . Allergy   . Arthritis   . BPH (benign prostatic hyperplasia)   . Chronic insomnia   . Chronic rhinitis   . Duodenitis   . ED (erectile dysfunction)   . Esophageal stricture   . Heart murmur    in his teens  . Hemorrhoids   . Hyperlipidemia   . Nonspecific elevation of levels of transaminase or lactic acid dehydrogenase (LDH)   . Prostate cancer [facility])     Past Surgical History:  Procedure Laterality Date  . CATARACT EXTRACTION     lens implants  . COLONOSCOPY    . DERMOID CYST  EXCISION     arm  . HEMORRHOID SURGERY    . NASAL SINUS SURGERY     2013  . POLYPECTOMY    . RADIOACTIVE SEED IMPLANT  1999  . TONSILLECTOMY AND ADENOIDECTOMY    . TOTAL HIP ARTHROPLASTY Left 03/17/2017   Procedure: LEFT TOTAL HIP ARTHROPLASTY ANTERIOR APPROACH;  Surgeon: Gaynelle Arabian, MD;  Location: [facility];  Service: Orthopedics;  Laterality: Left;     reports that  has never smoked. he has never used smokeless tobacco. He reports that he drinks about 0.6 oz of alcohol per week. He reports that he does not use drugs.  No Known Allergies  Family History  Problem Relation Age of Onset  . Heart disease Father   . Colon polyps Mother        Brother, and Sister  . Colon cancer Neg Hx   . Esophageal cancer Neg Hx   . Stomach cancer Neg Hx   . Rectal cancer Neg Hx     Prior to Admission medications   Medication Sig Start Date End Date Taking? Authorizing Provider    LORazepam (ATIVAN) 1 MG tablet Take 0.5-1 mg by mouth at bedtime.    Yes [provider]  methocarbamol (ROBAXIN) 500 MG tablet Take 1 tablet (500 mg total) by mouth every 6 (six) hours as needed for muscle spasms. 03/18/17  Yes Perkins, Alexzandrew L, PA-C  oxyCODONE (OXY IR/ROXICODONE) 5 MG immediate release tablet Take 1-2 tablets (5-10 mg total) by mouth every 4 (four) hours as needed for moderate pain or severe pain. 03/18/17  Yes Perkins, Alexzandrew L, PA-C  rivaroxaban (XARELTO) 10 MG TABS tablet Take 1 tablet (10 mg total) by mouth daily with breakfast. Take Xarelto for two and a half more weeks following discharge from the hospital, then discontinue Xarelto. Once the patient has completed the blood thinner regimen, then take a Baby 81 mg Aspirin daily for three more weeks. 03/18/17  Yes Perkins, Alexzandrew L, PA-C  Sennosides 25 MG TABS Take 100 mg by mouth at bedtime.   Yes [provider]  traMADol (ULTRAM) 50 MG tablet Take 1-2 tablets (50-100 mg total) by mouth every 6 (six) hours as needed for moderate pain. 03/18/17  Yes Perkins, Alexzandrew L, PA-C  triamcinolone cream (KENALOG) 0.1 % Apply 1 application topically daily as needed (itching, rash).   Yes [provider]    Physical Exam: Vitals:   03/19/17 1110 03/19/17 1200  BP: 127/69 130/65  Pulse: 68 67  Resp: 18 (!) 23  Temp: 97.6 F (36.4 C)   TempSrc: Oral   SpO2: 100% 100%    Constitutional: NAD, calm, comfortable Vitals:   03/19/17 1110 03/19/17 1200  BP: 127/69 130/65  Pulse: 68 67  Resp: 18 (!) 23  Temp: 97.6 F (36.4 C)   TempSrc: Oral   SpO2: 100% 100%   Eyes: PERRL, lids and conjunctivae normal ENMT: Mucous membranes. Posterior pharynx clear of any exudate or lesions.Normal dentition.  Neck: normal, supple, no masses, no thyromegaly Respiratory: clear to auscultation bilaterally, no wheezing, no crackles. Normal respiratory effort. No accessory muscle use.  Cardiovascular:  Regular rate and rhythm, no murmurs / rubs / gallops. No extremity edema. 2+ pedal pulses. No carotid bruits.  Abdomen: no tenderness, no masses palpated. No hepatosplenomegaly. Bowel sounds positive.  Musculoskeletal: no clubbing / cyanosis. No joint deformity upper and lower extremities. Good ROM, no contractures. Normal muscle tone.  Skin: no rashes, lesions, ulcers. No induration Neurologic: CN 2-12 grossly intact. Sensation intact, DTR normal. Strength 5/5 in all 4.  Psychiatric: Normal judgment and insight. Alert and oriented x 3. Normal mood.    Labs on Admission: I have personally reviewed following labs and imaging studies  CBC: Recent Labs  Lab 03/12/17 1456 03/18/17 0503 03/19/17 1144  WBC 7.3 13.5* 16.2*  NEUTROABS  --   --  13.7*  HGB 14.3 12.2* 12.2*  HCT 42.0 35.8* 35.8*  MCV 94.0 92.7 94.0  PLT 269 248 626   Basic Metabolic Panel: Recent Labs  Lab 03/12/17 1456 03/18/17 0503 03/19/17 1144  NA 136 132* 136  K 4.4 4.1 3.8  CL 101 101 104  CO2 29 24 27   GLUCOSE 99 131* 108*  BUN 14 12 19   CREATININE 0.85 0.66 0.91  CALCIUM 9.5 8.4* 8.5*   GFR: Estimated Creatinine Clearance: 68.3 mL/min (by C-G formula based on SCr of 0.91 mg/dL). Liver Function Tests: Recent Labs  Lab 03/12/17 1456  AST 23  ALT 16*  ALKPHOS 79  BILITOT 0.8  PROT 7.3  ALBUMIN 4.1   No results for input(s): LIPASE, AMYLASE in the last 168 hours. No results for input(s): AMMONIA in the last 168 hours. Coagulation Profile: Recent Labs  Lab 03/12/17 1456  INR 0.98   Cardiac Enzymes: No results for input(s): CKTOTAL, CKMB, CKMBINDEX, TROPONINI in the last 168 hours. BNP (last 3 results) No results for input(s): PROBNP in the last 8760 hours. HbA1C: No results for input(s): HGBA1C in the last 72 hours. CBG: No results for input(s): GLUCAP in the last 168 hours. Lipid Profile: No results for input(s): CHOL, HDL, LDLCALC, TRIG, CHOLHDL, LDLDIRECT in the last 72 hours. Thyroid  Function Tests: No results for input(s): TSH, T4TOTAL, FREET4, T3FREE, THYROIDAB in the last 72 hours. Anemia Panel: No results for input(s): VITAMINB12, FOLATE, FERRITIN, TIBC, IRON, RETICCTPCT in the last 72 hours. Urine analysis: No results found for: COLORURINE, APPEARANCEUR, LABSPEC, PHURINE, GLUCOSEU, HGBUR, BILIRUBINUR, KETONESUR, PROTEINUR, UROBILINOGEN, NITRITE, LEUKOCYTESUR Sepsis Labs: !!!!!!!!!!!!!!!!!!!!!!!!!!!!!!!!!!!!!!!!!!!! @LABRCNTIP (procalcitonin:4,lacticidven:4) ) Recent Results (from the past 240 hour(s))  Surgical pcr screen     Status: Abnormal   Collection Time: 03/12/17  2:15 PM  Result Value Ref Range Status   MRSA, PCR NEGATIVE NEGATIVE Final   Staphylococcus aureus POSITIVE (A) NEGATIVE Final    Comment: (NOTE) The Xpert SA Assay (FDA approved for NASAL specimens in patients 70 years of age and older), is one component of a comprehensive surveillance program. It is not intended to diagnose infection nor to guide or monitor treatment.      Radiological Exams on Admission: Dg Pelvis Portable  Result Date: 03/17/2017 CLINICAL DATA:  83 year old male with a history of left hip replacement EXAM: PORTABLE PELVIS 1-2 VIEWS COMPARISON:  03/17/2017 FINDINGS: Surgical changes of left hip arthroplasty with surgical drain projecting in the soft tissues. No fracture identified. Urinary catheter projects over the pelvis. Surgical changes of prior brachy therapy of the prostate. Degenerative changes of the right hip. IMPRESSION: Early surgical changes of left hip arthroplasty with surgical drain in place. Urinary catheter projects over the pelvis Electronically Signed   By: Corrie Mckusick D.O.   On: 03/17/2017 15:05   Dg C-arm 1-60 Min-no Report  Result Date: 03/17/2017 Fluoroscopy was utilized by the requesting physician.  No radiographic interpretation.    EKG: Independently reviewed. NSR  Assessment/Plan Principal Problem:   Syncope, vasovagal Active Problems:    PROSTATE CANCER   OA (osteoarthritis) of hip   Dehydration   Constipation   Syncope and collapse   1. Syncope 1. Unclear etiology, consider vasovagal in setting of constipation vs dehydration 2. Mucus membranes are dry with decreased skin turgor. Pt complaining of increased thirst 3. Orthostatics are negative by vital signs although pt was s/p 500cc bolus prior to orthostatic vitals 4. Clinically, patient's syncope was brought upon on standing/position changes 5. Renal function at baseline 6. Will continue patient with IVF hydration overnight 7. Will treat constipation per below 8. Continue pt on tele 9. Most recent 2d echo from 2014. Will repeat study. If structural abnormality, would consider EP consultation at that time 10. As patient is currently on postoperative  Xarelto, will order noncontrast head CT to rule out acute intracranial pathology. 2. Osteoarthritis 1. Patient is s/p recent hip surgery per Orthopedic surgery, discharged 12/6 2. Continue analgesics as tolerated 3. Cont prophylactic xarelto per ortho 4. Consult PT/OT 3. Dehydration 1. Dry membranes and decreased skin turgor per above 2. Cont IVF hydration 4. Constipation 1. No BM in over 3 days per patient 2. Will give trial of Mg citrate. May benefit from enema if no relief  DVT prophylaxis: Xarelto  Code Status: Full Family Communication: Pt in room, Family at bedside  Disposition Plan: Uncertain at this time  Consults called:  Admission status: Observation as would likely require less than 2 midnight stay to work up syncope   Marylu Lund MD [facility] Pager [phone]  If 7PM-7AM, please contact night-coverage [email or web address] Password [email or web address]  03/19/2017, 1:56 PM

## 2017-03-19 NOTE — ED Notes (Signed)
ED TO INPATIENT HANDOFF REPORT  Name/Age/Gender Stephen Mcclain 83 y.o. male  Code Status    Code Status Orders  (From admission, onward)        Start     Ordered   03/19/17 1352  Full code  Continuous     03/19/17 1355    Code Status History    Date Active Date Inactive Code Status Order ID Comments User Context   03/17/2017 16:56 03/18/2017 18:31 Full Code [id number]  Gaynelle Arabian, MD Inpatient      Home/SNF/Other Home  Chief Complaint syncope  Level of Care/Admitting Diagnosis ED Disposition    ED Disposition Condition [facility] [facility] Area: [facility] [[id number]]  Level of Care: Telemetry [5]  Admit to tele based on following criteria: Eval of Syncope  Diagnosis: Syncope and collapse [780.2.ICD-9-CM]  Admitting Physician: Donne Hazel [[id number]]  Attending Physician: CHIU, STEPHEN K [[id number]]  PT Class (Do Not Modify): Observation [104]  PT Acc Code (Do Not Modify): Observation [10022]       Medical History Past Medical History:  Diagnosis Date  . Adenomatous polyps 03/2001  . Allergy   . Arthritis   . BPH (benign prostatic hyperplasia)   . Chronic insomnia   . Chronic rhinitis   . Duodenitis   . ED (erectile dysfunction)   . Esophageal stricture   . Heart murmur    in his teens  . Hemorrhoids   . Hyperlipidemia   . Nonspecific elevation of levels of transaminase or lactic acid dehydrogenase (LDH)   . Prostate cancer ([place])     Allergies No Known Allergies  IV Location/Drains/Wounds Patient Lines/Drains/Airways Status   Active Line/Drains/Airways    Name:   Placement date:   Placement time:   Site:   Days:   Peripheral IV 03/19/17 Left Antecubital   03/19/17    1104    Antecubital   less than 1   Incision (Closed) 03/17/17 Hip Left   03/17/17    1239     2          Labs/Imaging Results for orders placed or performed during the hospital encounter of 03/19/17 (from the past 48 hour(s))  CBC with  Differential/Platelet     Status: Abnormal   Collection Time: 03/19/17 11:44 AM  Result Value Ref Range   WBC 16.2 (H) 4.0 - 10.5 K/uL   RBC 3.81 (L) 4.22 - 5.81 MIL/uL   Hemoglobin 12.2 (L) 13.0 - 17.0 g/dL   HCT 35.8 (L) 39.0 - 52.0 %   MCV 94.0 78.0 - 100.0 fL   MCH 32.0 26.0 - 34.0 pg   MCHC 34.1 30.0 - 36.0 g/dL   RDW 13.0 11.5 - 15.5 %   Platelets 243 150 - 400 K/uL   Neutrophils Relative % 84 %   Neutro Abs 13.7 (H) 1.7 - 7.7 K/uL   Lymphocytes Relative 6 %   Lymphs Abs 0.9 0.7 - 4.0 K/uL   Monocytes Relative 10 %   Monocytes Absolute 1.6 (H) 0.1 - 1.0 K/uL   Eosinophils Relative 0 %   Eosinophils Absolute 0.0 0.0 - 0.7 K/uL   Basophils Relative 0 %   Basophils Absolute 0.0 0.0 - 0.1 K/uL  Basic metabolic panel     Status: Abnormal   Collection Time: 03/19/17 11:44 AM  Result Value Ref Range   Sodium 136 135 - 145 mmol/L   Potassium 3.8 3.5 - 5.1 mmol/L   Chloride 104 101 -  111 mmol/L   CO2 27 22 - 32 mmol/L   Glucose, Bld 108 (H) 65 - 99 mg/dL   BUN 19 6 - 20 mg/dL   Creatinine, Ser 0.91 0.61 - 1.24 mg/dL   Calcium 8.5 (L) 8.9 - 10.3 mg/dL   GFR calc non Af Amer >60 >60 mL/min   GFR calc Af Amer >60 >60 mL/min    Comment: (NOTE) The eGFR has been calculated using the CKD EPI equation. This calculation has not been validated in all clinical situations. eGFR's persistently <60 mL/min signify possible Chronic Kidney Disease.    Anion gap 5 5 - 15  I-stat troponin, ED     Status: None   Collection Time: 03/19/17 12:13 PM  Result Value Ref Range   Troponin i, poc 0.01 0.00 - 0.08 ng/mL   Comment 3            Comment: Due to the release kinetics of cTnI, a negative result within the first hours of the onset of symptoms does not rule out myocardial infarction with certainty. If myocardial infarction is still suspected, repeat the test at appropriate intervals.    Dg Pelvis Portable  Result Date: 03/17/2017 CLINICAL DATA:  83 year old male with a history of  left hip replacement EXAM: PORTABLE PELVIS 1-2 VIEWS COMPARISON:  03/17/2017 FINDINGS: Surgical changes of left hip arthroplasty with surgical drain projecting in the soft tissues. No fracture identified. Urinary catheter projects over the pelvis. Surgical changes of prior brachy therapy of the prostate. Degenerative changes of the right hip. IMPRESSION: Early surgical changes of left hip arthroplasty with surgical drain in place. Urinary catheter projects over the pelvis Electronically Signed   By: Corrie Mckusick D.O.   On: 03/17/2017 15:05    Pending Labs Unresulted Labs (From admission, onward)   Start     Ordered   03/20/17 0500  Comprehensive metabolic panel  Tomorrow morning,   R     03/19/17 1355   03/20/17 0500  CBC  Tomorrow morning,   R     03/19/17 1355      Vitals/Pain Today's Vitals   03/19/17 1110 03/19/17 1124 03/19/17 1200 03/19/17 1330  BP: 127/69  130/65 (!) 123/47  Pulse: 68  67 73  Resp: 18  (!) 23 14  Temp: 97.6 F (36.4 C)     TempSrc: Oral     SpO2: 100%  100% 98%  PainSc:  0-No pain      Isolation Precautions No active isolations  Medications Medications  sodium chloride flush (NS) 0.9 % injection 3 mL (not administered)  acetaminophen (TYLENOL) tablet 650 mg (not administered)    Or  acetaminophen (TYLENOL) suppository 650 mg (not administered)  morphine 2 MG/ML injection 1 mg (not administered)  polyethylene glycol (MIRALAX / GLYCOLAX) packet 17 g (not administered)  magnesium citrate solution 1 Bottle (not administered)  0.9 %  sodium chloride infusion (not administered)  methocarbamol (ROBAXIN) tablet 500 mg (not administered)  oxyCODONE (Oxy IR/ROXICODONE) immediate release tablet 5-10 mg (not administered)  rivaroxaban (XARELTO) tablet 10 mg (not administered)    Mobility walks with device

## 2017-03-19 NOTE — ED Triage Notes (Addendum)
Patient brought in by EMS from home with syncopal episode with loss of consciousness. Patient had hip replacement surgery on 12/5 and was working with PT at home when he started to feel dizzy, then he lost consciousness. Patient was caught and did not hit the ground. After EMS was called, patient was checked and was found to have positive orthostatics and experienced a second syncopal episode upon standing. Patient was caught once again. 18G L AC placed by EMS. 500ML NS IV bolused to patient by EMS. Patient AxOx4 in triage. Patient states he took his prescribed Norco for pain at 0700 this morning.

## 2017-03-19 NOTE — ED Notes (Signed)
Assigned 1424 @ 14:13 call report @ 14:33

## 2017-03-19 NOTE — Care Management Note (Signed)
Case Management Note  Patient Details  Name: Stephen Mcclain MRN: [id number] Date of Birth: May 10, 1930  Pt is active with [facility].  Tim with [facility] is aware pt is here and will follow for needs.  Expected Discharge Date:   Unknown               Expected Discharge Plan:  [place] Choice:  Home Health Choice offered to:  Patient  HH Arranged:  PT [place]:  [facility] ([facility])  Status of Service:  In process, will continue to follow  Rae Mar, RN 03/19/2017, 11:10 AM

## 2017-03-19 NOTE — ED Notes (Signed)
Patient requesting bathroom assistance. Attempted to provide assistance using the steady to transport patient to restroom. Patient visitor reports patient is attempting to use the urinal at this time and requesting "privacy."

## 2017-03-20 ENCOUNTER — Observation Stay (HOSPITAL_BASED_OUTPATIENT_CLINIC_OR_DEPARTMENT_OTHER): Payer: Medicare Other

## 2017-03-20 DIAGNOSIS — R55 Syncope and collapse: Secondary | ICD-10-CM

## 2017-03-20 DIAGNOSIS — I340 Nonrheumatic mitral (valve) insufficiency: Secondary | ICD-10-CM | POA: Diagnosis not present

## 2017-03-20 DIAGNOSIS — E860 Dehydration: Secondary | ICD-10-CM

## 2017-03-20 LAB — ECHOCARDIOGRAM COMPLETE
Height: 73 in
Weight: 3082.91 oz

## 2017-03-20 LAB — COMPREHENSIVE METABOLIC PANEL
ALBUMIN: 3.3 g/dL — AB (ref 3.5–5.0)
ALK PHOS: 50 U/L (ref 38–126)
ALT: 16 U/L — AB (ref 17–63)
AST: 37 U/L (ref 15–41)
Anion gap: 7 (ref 5–15)
BUN: 18 mg/dL (ref 6–20)
CALCIUM: 8.2 mg/dL — AB (ref 8.9–10.3)
CHLORIDE: 100 mmol/L — AB (ref 101–111)
CO2: 27 mmol/L (ref 22–32)
CREATININE: 0.83 mg/dL (ref 0.61–1.24)
GFR calc non Af Amer: >60 mL/min (ref >60)
GLUCOSE: 95 mg/dL (ref 65–99)
Potassium: 3.7 mmol/L (ref 3.5–5.1)
SODIUM: 134 mmol/L — AB (ref 135–145)
Total Bilirubin: 1.2 mg/dL (ref 0.3–1.2)
Total Protein: 6 g/dL — ABNORMAL LOW (ref 6.5–8.1)

## 2017-03-20 LAB — CBC
HCT: 32.4 % — ABNORMAL LOW (ref 39.0–52.0)
Hemoglobin: 10.9 g/dL — ABNORMAL LOW (ref 13.0–17.0)
MCH: 31.6 pg (ref 26.0–34.0)
MCHC: 33.6 g/dL (ref 30.0–36.0)
MCV: 93.9 fL (ref 78.0–100.0)
PLATELETS: 245 10*3/uL (ref 150–400)
RBC: 3.45 MIL/uL — AB (ref 4.22–5.81)
RDW: 13 % (ref 11.5–15.5)
WBC: 11.2 10*3/uL — ABNORMAL HIGH (ref 4.0–10.5)

## 2017-03-20 LAB — GLUCOSE, CAPILLARY: GLUCOSE-CAPILLARY: 84 mg/dL (ref 65–99)

## 2017-03-20 MED ORDER — MAGNESIUM CITRATE PO SOLN
0.5000 | Freq: Once | ORAL | Status: AC
  Administered 2017-03-20: 0.5 via ORAL
  Filled 2017-03-20: qty 296

## 2017-03-20 NOTE — Progress Notes (Signed)
  Echocardiogram 2D Echocardiogram has been performed.  Merrie Roof F 03/20/2017, 11:04 AM

## 2017-03-20 NOTE — Evaluation (Signed)
Physical Therapy Evaluation Patient Details Name: Stephen Mcclain MRN: [id number] DOB: 08-29-1930 Today's Date: 03/20/2017   History of Present Illness  Pt admitted from home with syncope.  Pt s/p L THR earlier this week  Clinical Impression  Pt admitted as above and presenting with functional mobility limitations 2* decreased L LE strength/ROM and post op pain.  Pt plans dc home with family assist and follow up HHPT.  Reviewed therex and shower transfers with pt and spouse.  Pt and spouse with multiple questions asked and answered.    Follow Up Recommendations Home health PT    Equipment Recommendations  None recommended by PT    Recommendations for Other Services       Precautions / Restrictions Precautions Precautions: Fall Restrictions Weight Bearing Restrictions: No Other Position/Activity Restrictions: WBAT      Mobility  Bed Mobility Overal bed mobility: Needs Assistance Bed Mobility: Supine to Sit;Sit to Supine     Supine to sit: Supervision Sit to supine: Min guard   General bed mobility comments: cues for sequence and use of R LE to self assist  Transfers Overall transfer level: Needs assistance Equipment used: Rolling walker (2 wheeled) Transfers: Sit to/from Stand Sit to Stand: Min guard;Supervision         General transfer comment: cues for safety and for LE management and use of UEs to self assist  Ambulation/Gait Ambulation/Gait assistance: Min guard;Supervision Ambulation Distance (Feet): 250 Feet Assistive device: Rolling walker (2 wheeled) Gait Pattern/deviations: Step-through pattern;Decreased step length - right;Decreased step length - left;Shuffle;Trunk flexed     General Gait Details: cues for posture and position from [employer]            Wheelchair Mobility    Modified Rankin (Stroke Patients Only)       Balance                                             Pertinent Vitals/Pain Pain Assessment:  0-10 Pain Score: 3  Pain Location: L hip Pain Descriptors / Indicators: Sore Pain Intervention(s): Limited activity within patient's tolerance;Monitored during session    Home Living Family/patient expects to be discharged to:: Private residence Living Arrangements: Spouse/significant other Available Help at Discharge: Family;Available 24 hours/day Type of Home: House Home Access: Stairs to enter Entrance Stairs-Rails: None Entrance Stairs-Number of Steps: 2 Home Layout: One level Home Equipment: Walker - 2 wheels;Bedside commode;Cane - single point      Prior Function Level of Independence: Needs assistance   Gait / Transfers Assistance Needed: ambulating with RW since THR  ADL's / Homemaking Assistance Needed: assist of spouse        Hand Dominance        Extremity/Trunk Assessment   Upper Extremity Assessment Upper Extremity Assessment: Overall WFL for tasks assessed    Lower Extremity Assessment Lower Extremity Assessment: LLE deficits/detail LLE Deficits / Details: Strength at hip 2+/5 with AAROM at hip to 90 flex and 25 abd       Communication   Communication: No difficulties  Cognition Arousal/Alertness: Awake/alert Behavior During Therapy: WFL for tasks assessed/performed;Impulsive Overall Cognitive Status: Within Functional Limits for tasks assessed  General Comments      Exercises Total Joint Exercises Ankle Circles/Pumps: AROM;Both;20 reps;Supine Quad Sets: AROM;Both;10 reps;Supine Heel Slides: AAROM;Left;20 reps;Supine Hip ABduction/ADduction: AAROM;Left;Supine;20 reps Long Arc Quad: AROM;Left;10 reps;Seated   Assessment/Plan    PT Assessment Patient needs continued PT services  PT Problem List Decreased strength;Decreased range of motion;Decreased activity tolerance;Decreased mobility;Pain;Decreased knowledge of use of DME       PT Treatment Interventions DME instruction;Gait  training;Stair training;Functional mobility training;Therapeutic activities;Therapeutic exercise;Patient/family education    PT Goals (Current goals can be found in the Care Plan section)  Acute Rehab PT Goals Patient Stated Goal: Regain IND  PT Goal Formulation: With patient Time For Goal Achievement: 03/21/17 Potential to Achieve Goals: Good    Frequency 7X/week   Barriers to discharge        Co-evaluation               AM-PAC PT "6 Clicks" Daily Activity  Outcome Measure Difficulty turning over in bed (including adjusting bedclothes, sheets and blankets)?: A Lot Difficulty moving from lying on back to sitting on the side of the bed? : A Lot Difficulty sitting down on and standing up from a chair with arms (e.g., wheelchair, bedside commode, etc,.)?: A Little Help needed moving to and from a bed to chair (including a wheelchair)?: A Little Help needed walking in hospital room?: A Little Help needed climbing 3-5 steps with a railing? : A Little 6 Click Score: 16    End of Session Equipment Utilized During Treatment: Gait belt Activity Tolerance: Patient tolerated treatment well Patient left: with call bell/phone within reach;with family/visitor present;in chair Nurse Communication: Mobility status PT Visit Diagnosis: Difficulty in walking, not elsewhere classified (R26.2)    Time: 1410-1445 PT Time Calculation (min) (ACUTE ONLY): 35 min   Charges:   PT Evaluation $PT Eval Low Complexity: 1 Low PT Treatments $Therapeutic Activity: 8-22 mins   PT G Codes:   PT G-Codes **NOT FOR INPATIENT CLASS** Functional Assessment Tool Used: Clinical judgement Functional Limitation: Mobility: Walking and moving around Mobility: Walking and Moving Around Current Status (B7628): At least 20 percent but less than 40 percent impaired, limited or restricted Mobility: Walking and Moving Around Goal Status [phone]): At least 20 percent but less than 40 percent impaired, limited or  restricted Mobility: Walking and Moving Around Discharge Status [phone]): At least 20 percent but less than 40 percent impaired, limited or restricted    Pg [phone]   Bintou Lafata 03/20/2017, 3:38 PM

## 2017-03-20 NOTE — Progress Notes (Signed)
*  PRELIMINARY RESULTS* Vascular Ultrasound Carotid Duplex (Doppler) has been completed.   Findings suggest 1-39% internal carotid artery stenosis bilaterally. Vertebral arteries are patent with antegrade flow.  03/20/2017 11:02 AM Maudry Mayhew, BS, RVT, RDCS, RDMS

## 2017-03-23 DIAGNOSIS — Z7901 Long term (current) use of anticoagulants: Secondary | ICD-10-CM | POA: Diagnosis not present

## 2017-03-23 DIAGNOSIS — Z86010 Personal history of colonic polyps: Secondary | ICD-10-CM | POA: Diagnosis not present

## 2017-03-23 DIAGNOSIS — N400 Enlarged prostate without lower urinary tract symptoms: Secondary | ICD-10-CM | POA: Diagnosis not present

## 2017-03-23 DIAGNOSIS — Z471 Aftercare following joint replacement surgery: Secondary | ICD-10-CM | POA: Diagnosis not present

## 2017-03-23 DIAGNOSIS — Z96642 Presence of left artificial hip joint: Secondary | ICD-10-CM | POA: Diagnosis not present

## 2017-03-23 DIAGNOSIS — M069 Rheumatoid arthritis, unspecified: Secondary | ICD-10-CM | POA: Diagnosis not present

## 2017-03-23 NOTE — Discharge Summary (Signed)
Physician Discharge Summary  Stephen Mcclain [id number] DOB: 02-08-1931 DOA: 03/19/2017  PCP: Thressa Sheller, MD  Admit date: 03/19/2017 Discharge date: 03/20/2017  Admitted From: Home.  Disposition:  Home with Home health.   Recommendations for Outpatient Follow-up:  1. Follow up with PCP in 1-2 weeks 2. Please obtain BMP/CBC in one week   Discharge Condition:STABLE.  CODE STATUS: FULL CODE.  Diet recommendation: Heart Healthy   Brief/Interim Summary: Stephen Mcclain is a 83 y.o. male with medical history significant of osteoarthritis of the hip, status post recent surgery for which patient was discharged 1 day prior to hospital admission, BPH, hyperlipidemia.  Patient states that after arriving home from the hospital, patient reported increased weakness over his baseline associated with feelings of "dizziness" that is made worse with standing up with walker. He attributes the dizziness to the oxycodone.    Discharge Diagnoses:  Principal Problem:   Syncope, vasovagal Active Problems:   PROSTATE CANCER   OA (osteoarthritis) of hip   Dehydration   Constipation   Syncope and collapse  Pre syncopal episode:  Differential include vaso vagal vs pain meds vs orthostatic  Echocardiogram shows LVEF 60%, WALL motion was normal, no regional wall motion abn. Grade 1 diastolic dysfunction.  Repeat orthostatic negative.  We stopped the oxy on discharge. Worked with PT . D/c home with home health PT.    Dehydration:  NS hydration.     Discharge Instructions  Discharge Instructions    Diet - low sodium heart healthy   Complete by:  As directed    Discharge instructions   Complete by:  As directed    RECOMMEND follow up with cardiology if symptoms reoccur.  Please stop the oxycodone if possible.  Please follow up with PCP in one week.     Allergies as of 03/20/2017   No Known Allergies     Medication List    TAKE these medications   LORazepam 1 MG tablet Commonly known  as:  ATIVAN Take 0.5-1 mg by mouth at bedtime.   methocarbamol 500 MG tablet Commonly known as:  ROBAXIN Take 1 tablet (500 mg total) by mouth every 6 (six) hours as needed for muscle spasms.   oxyCODONE 5 MG immediate release tablet Commonly known as:  Oxy IR/ROXICODONE Take 1-2 tablets (5-10 mg total) by mouth every 4 (four) hours as needed for moderate pain or severe pain.   rivaroxaban 10 MG Tabs tablet Commonly known as:  XARELTO Take 1 tablet (10 mg total) by mouth daily with breakfast. Take Xarelto for two and a half more weeks following discharge from the hospital, then discontinue Xarelto. Once the patient has completed the blood thinner regimen, then take a Baby 81 mg Aspirin daily for three more weeks.   Sennosides 25 MG Tabs Take 100 mg by mouth at bedtime.   traMADol 50 MG tablet Commonly known as:  ULTRAM Take 1-2 tablets (50-100 mg total) by mouth every 6 (six) hours as needed for moderate pain.   triamcinolone cream 0.1 % Commonly known as:  KENALOG Apply 1 application topically daily as needed (itching, rash).      Follow-up Information    [facility] Follow up.   Specialty:  Home Health Services Why:  PT Contact information: [place] [address] [phone]        Thressa Sheller, MD. Schedule an appointment as soon as possible for a visit in 1 week(s).   Specialty:  Internal Medicine  Contact information: [place], [address] [place] [address] [phone]          No Known Allergies  Consultations:  None.    Procedures/Studies: Ct Head Wo Contrast  Result Date: 03/19/2017 CLINICAL DATA:  Syncope. EXAM: CT HEAD WITHOUT CONTRAST TECHNIQUE: Contiguous axial images were obtained from the base of the skull through the vertex without intravenous contrast. COMPARISON:  None. FINDINGS: Brain: No evidence of acute infarction, hemorrhage, hydrocephalus, extra-axial collection or mass lesion/mass  effect. Mild age-related cerebral atrophy with compensatory dilatation of the ventricles. Vascular: No hyperdense vessel or unexpected calcification. Skull: Normal. Negative for fracture or focal lesion. Sinuses/Orbits: Scattered around paranasal sinus mucosal thickening. Mastoid air cells are clear. Bilateral lens replacement. Other: None. IMPRESSION: No acute intracranial abnormality. Mild age related cerebral atrophy. Electronically Signed   By: Titus Dubin M.D.   On: 03/19/2017 16:09   Dg Pelvis Portable  Result Date: 03/17/2017 CLINICAL DATA:  83 year old male with a history of left hip replacement EXAM: PORTABLE PELVIS 1-2 VIEWS COMPARISON:  03/17/2017 FINDINGS: Surgical changes of left hip arthroplasty with surgical drain projecting in the soft tissues. No fracture identified. Urinary catheter projects over the pelvis. Surgical changes of prior brachy therapy of the prostate. Degenerative changes of the right hip. IMPRESSION: Early surgical changes of left hip arthroplasty with surgical drain in place. Urinary catheter projects over the pelvis Electronically Signed   By: Corrie Mckusick D.O.   On: 03/17/2017 15:05   Dg C-arm 1-60 Min-no Report  Result Date: 03/17/2017 Fluoroscopy was utilized by the requesting physician.  No radiographic interpretation.       Subjective: No complaints.   Discharge Exam: Vitals:   03/19/17 2103 03/20/17 0523  BP: 129/70 116/62  Pulse: 71 (!) 59  Resp: 16 16  Temp: 99.2 F (37.3 C) 98.6 F (37 C)  SpO2: 96% 96%   Vitals:   03/19/17 1459 03/19/17 1540 03/19/17 2103 03/20/17 0523  BP: (!) 150/89 (!) 150/89 129/70 116/62  Pulse: 69 69 71 (!) 59  Resp: 16 16 16 16   Temp: 97.9 F (36.6 C) 97.9 F (36.6 C) 99.2 F (37.3 C) 98.6 F (37 C)  TempSrc: Oral Oral Oral Oral  SpO2: 100%  96% 96%  Weight:  87.6 kg (193 lb 2 oz)  87.4 kg (192 lb 10.9 oz)  Height:  6\' 1"  (1.854 m)      General: Pt is alert, awake, not in acute  distress Cardiovascular: RRR, S1/S2 +, no rubs, no gallops Respiratory: CTA bilaterally, no wheezing, no rhonchi Abdominal: Soft, NT, ND, bowel sounds + Extremities: no edema, no cyanosis    The results of significant diagnostics from this hospitalization (including imaging, microbiology, ancillary and laboratory) are listed below for reference.     Microbiology: No results found for this or any previous visit (from the past 240 hour(s)).   Labs: BNP (last 3 results) No results for input(s): BNP in the last 8760 hours. Basic Metabolic Panel: Recent Labs  Lab 03/18/17 0503 03/19/17 1144 03/20/17 0524  NA 132* 136 134*  K 4.1 3.8 3.7  CL 101 104 100*  CO2 24 27 27   GLUCOSE 131* 108* 95  BUN 12 19 18   CREATININE 0.66 0.91 0.83  CALCIUM 8.4* 8.5* 8.2*   Liver Function Tests: Recent Labs  Lab 03/20/17 0524  AST 37  ALT 16*  ALKPHOS 50  BILITOT 1.2  PROT 6.0*  ALBUMIN 3.3*   No results for input(s): LIPASE, AMYLASE in the  last 168 hours. No results for input(s): AMMONIA in the last 168 hours. CBC: Recent Labs  Lab 03/18/17 0503 03/19/17 1144 03/20/17 0524  WBC 13.5* 16.2* 11.2*  NEUTROABS  --  13.7*  --   HGB 12.2* 12.2* 10.9*  HCT 35.8* 35.8* 32.4*  MCV 92.7 94.0 93.9  PLT 248 243 245   Cardiac Enzymes: No results for input(s): CKTOTAL, CKMB, CKMBINDEX, TROPONINI in the last 168 hours. BNP: Invalid input(s): POCBNP CBG: Recent Labs  Lab 03/20/17 0739  GLUCAP 84   D-Dimer No results for input(s): DDIMER in the last 72 hours. Hgb A1c No results for input(s): HGBA1C in the last 72 hours. Lipid Profile No results for input(s): CHOL, HDL, LDLCALC, TRIG, CHOLHDL, LDLDIRECT in the last 72 hours. Thyroid function studies No results for input(s): TSH, T4TOTAL, T3FREE, THYROIDAB in the last 72 hours.  Invalid input(s): FREET3 Anemia work up No results for input(s): VITAMINB12, FOLATE, FERRITIN, TIBC, IRON, RETICCTPCT in the last 72  hours. Urinalysis No results found for: COLORURINE, APPEARANCEUR, LABSPEC, [place], GLUCOSEU, HGBUR, BILIRUBINUR, KETONESUR, PROTEINUR, UROBILINOGEN, NITRITE, LEUKOCYTESUR Sepsis Labs Invalid input(s): PROCALCITONIN,  WBC,  LACTICIDVEN Microbiology No results found for this or any previous visit (from the past 240 hour(s)).   Time coordinating discharge: Over 30 minutes  SIGNED:   Hosie Poisson, MD  [facility] 03/23/2017, 10:43 PM Pager   If 7PM-7AM, please contact night-coverage [email or web address] Password [email or web address]

## 2017-03-24 DIAGNOSIS — N400 Enlarged prostate without lower urinary tract symptoms: Secondary | ICD-10-CM | POA: Diagnosis not present

## 2017-03-24 DIAGNOSIS — Z7901 Long term (current) use of anticoagulants: Secondary | ICD-10-CM | POA: Diagnosis not present

## 2017-03-24 DIAGNOSIS — Z86010 Personal history of colonic polyps: Secondary | ICD-10-CM | POA: Diagnosis not present

## 2017-03-24 DIAGNOSIS — Z96642 Presence of left artificial hip joint: Secondary | ICD-10-CM | POA: Diagnosis not present

## 2017-03-24 DIAGNOSIS — Z471 Aftercare following joint replacement surgery: Secondary | ICD-10-CM | POA: Diagnosis not present

## 2017-03-24 DIAGNOSIS — M069 Rheumatoid arthritis, unspecified: Secondary | ICD-10-CM | POA: Diagnosis not present

## 2017-03-26 DIAGNOSIS — N400 Enlarged prostate without lower urinary tract symptoms: Secondary | ICD-10-CM | POA: Diagnosis not present

## 2017-03-26 DIAGNOSIS — Z86010 Personal history of colonic polyps: Secondary | ICD-10-CM | POA: Diagnosis not present

## 2017-03-26 DIAGNOSIS — Z96642 Presence of left artificial hip joint: Secondary | ICD-10-CM | POA: Diagnosis not present

## 2017-03-26 DIAGNOSIS — M069 Rheumatoid arthritis, unspecified: Secondary | ICD-10-CM | POA: Diagnosis not present

## 2017-03-26 DIAGNOSIS — Z471 Aftercare following joint replacement surgery: Secondary | ICD-10-CM | POA: Diagnosis not present

## 2017-03-26 DIAGNOSIS — Z7901 Long term (current) use of anticoagulants: Secondary | ICD-10-CM | POA: Diagnosis not present

## 2017-03-29 DIAGNOSIS — Z86010 Personal history of colonic polyps: Secondary | ICD-10-CM | POA: Diagnosis not present

## 2017-03-29 DIAGNOSIS — N400 Enlarged prostate without lower urinary tract symptoms: Secondary | ICD-10-CM | POA: Diagnosis not present

## 2017-03-29 DIAGNOSIS — M069 Rheumatoid arthritis, unspecified: Secondary | ICD-10-CM | POA: Diagnosis not present

## 2017-03-29 DIAGNOSIS — Z96642 Presence of left artificial hip joint: Secondary | ICD-10-CM | POA: Diagnosis not present

## 2017-03-29 DIAGNOSIS — Z471 Aftercare following joint replacement surgery: Secondary | ICD-10-CM | POA: Diagnosis not present

## 2017-03-29 DIAGNOSIS — Z7901 Long term (current) use of anticoagulants: Secondary | ICD-10-CM | POA: Diagnosis not present

## 2017-04-14 DIAGNOSIS — E559 Vitamin D deficiency, unspecified: Secondary | ICD-10-CM | POA: Diagnosis not present

## 2017-04-14 DIAGNOSIS — Z0000 Encounter for general adult medical examination without abnormal findings: Secondary | ICD-10-CM | POA: Diagnosis not present

## 2017-04-14 DIAGNOSIS — E785 Hyperlipidemia, unspecified: Secondary | ICD-10-CM | POA: Diagnosis not present

## 2017-04-14 DIAGNOSIS — E039 Hypothyroidism, unspecified: Secondary | ICD-10-CM | POA: Diagnosis not present

## 2017-04-15 DIAGNOSIS — Z0000 Encounter for general adult medical examination without abnormal findings: Secondary | ICD-10-CM | POA: Diagnosis not present

## 2017-04-23 DIAGNOSIS — Z0000 Encounter for general adult medical examination without abnormal findings: Secondary | ICD-10-CM | POA: Diagnosis not present

## 2017-04-27 DIAGNOSIS — Z96642 Presence of left artificial hip joint: Secondary | ICD-10-CM | POA: Diagnosis not present

## 2017-04-27 DIAGNOSIS — Z471 Aftercare following joint replacement surgery: Secondary | ICD-10-CM | POA: Diagnosis not present

## 2017-04-28 DIAGNOSIS — Z96649 Presence of unspecified artificial hip joint: Secondary | ICD-10-CM | POA: Insufficient documentation

## 2017-09-15 DIAGNOSIS — L570 Actinic keratosis: Secondary | ICD-10-CM | POA: Diagnosis not present

## 2017-09-15 DIAGNOSIS — D485 Neoplasm of uncertain behavior of skin: Secondary | ICD-10-CM | POA: Diagnosis not present

## 2018-03-14 DIAGNOSIS — Z961 Presence of intraocular lens: Secondary | ICD-10-CM | POA: Diagnosis not present

## 2018-03-14 DIAGNOSIS — H353132 Nonexudative age-related macular degeneration, bilateral, intermediate dry stage: Secondary | ICD-10-CM | POA: Diagnosis not present

## 2018-03-17 DIAGNOSIS — Z96642 Presence of left artificial hip joint: Secondary | ICD-10-CM | POA: Diagnosis not present

## 2018-03-17 DIAGNOSIS — Z471 Aftercare following joint replacement surgery: Secondary | ICD-10-CM | POA: Diagnosis not present

## 2018-03-23 DIAGNOSIS — J300 Vasomotor rhinitis: Secondary | ICD-10-CM | POA: Diagnosis not present

## 2018-03-23 DIAGNOSIS — H6993 Unspecified Eustachian tube disorder, bilateral: Secondary | ICD-10-CM | POA: Diagnosis not present

## 2018-03-31 DIAGNOSIS — G4700 Insomnia, unspecified: Secondary | ICD-10-CM | POA: Diagnosis not present

## 2018-03-31 DIAGNOSIS — E559 Vitamin D deficiency, unspecified: Secondary | ICD-10-CM | POA: Diagnosis not present

## 2018-04-19 DIAGNOSIS — Z79899 Other long term (current) drug therapy: Secondary | ICD-10-CM | POA: Diagnosis not present

## 2018-04-19 DIAGNOSIS — E559 Vitamin D deficiency, unspecified: Secondary | ICD-10-CM | POA: Diagnosis not present

## 2018-04-19 DIAGNOSIS — E785 Hyperlipidemia, unspecified: Secondary | ICD-10-CM | POA: Diagnosis not present

## 2018-04-19 DIAGNOSIS — E039 Hypothyroidism, unspecified: Secondary | ICD-10-CM | POA: Diagnosis not present

## 2018-05-04 DIAGNOSIS — Z0000 Encounter for general adult medical examination without abnormal findings: Secondary | ICD-10-CM | POA: Diagnosis not present

## 2018-05-04 DIAGNOSIS — E785 Hyperlipidemia, unspecified: Secondary | ICD-10-CM | POA: Diagnosis not present

## 2018-05-04 DIAGNOSIS — E039 Hypothyroidism, unspecified: Secondary | ICD-10-CM | POA: Diagnosis not present

## 2018-05-04 DIAGNOSIS — E559 Vitamin D deficiency, unspecified: Secondary | ICD-10-CM | POA: Diagnosis not present

## 2018-08-17 IMAGING — CT CT HEAD W/O CM
3 series · 15 of 47 positions shown, 18 images · non-contrast
Comparison: None.

CLINICAL DATA: Syncope.

EXAM:
CT HEAD WITHOUT CONTRAST
TECHNIQUE: Contiguous axial images were obtained from the base of the skull
through the vertex without intravenous contrast.

[Series 2: head wo · axial · 0.46mm/px · z∈[+1340,+1485]mm · 9 of 35 slices shown, 12 images]
[im 3/35  brain]
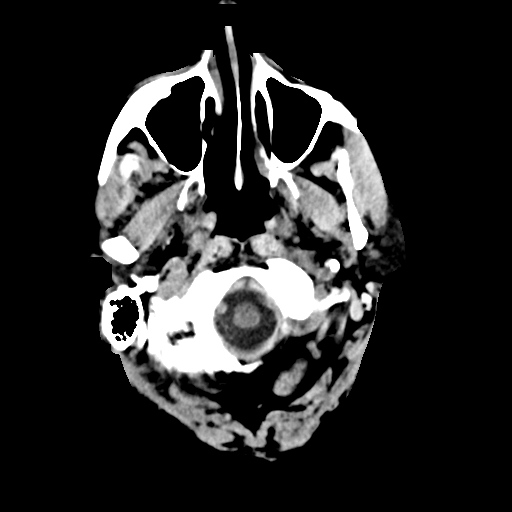
[im 3/35  bone]
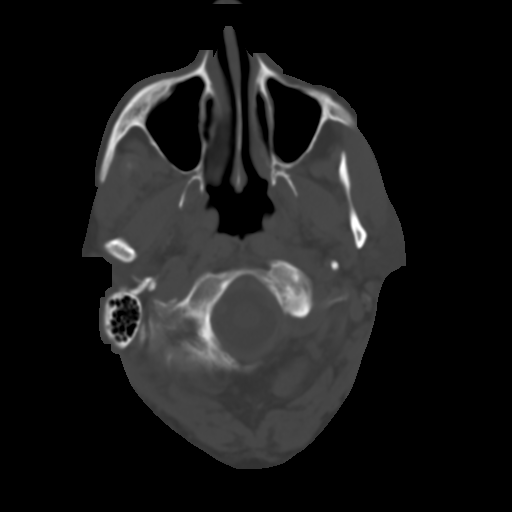
[im 6/35  brain]
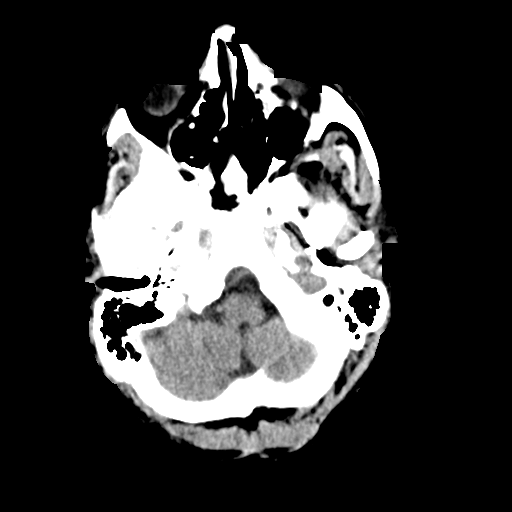
[im 10/35  brain]
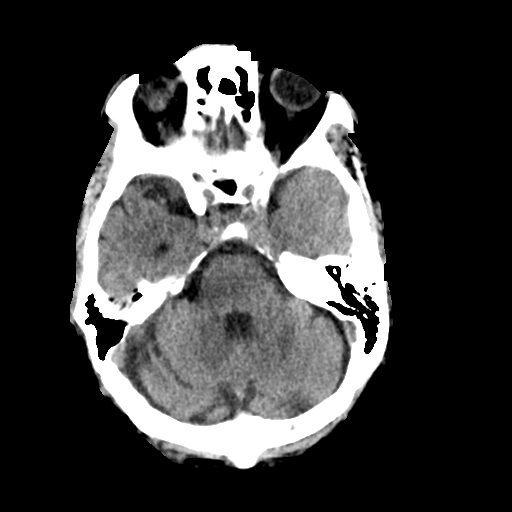
[im 13/35  brain]
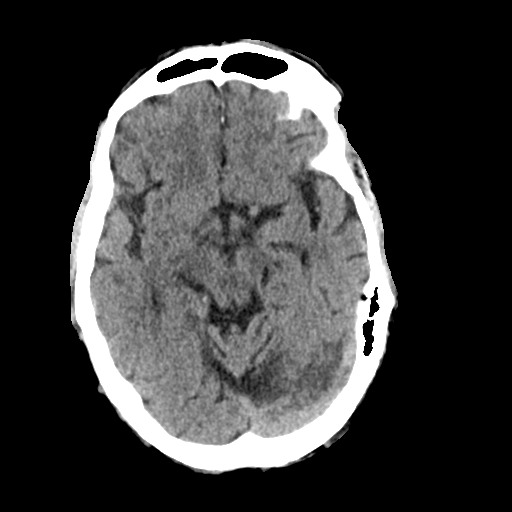
[im 18/35  brain]
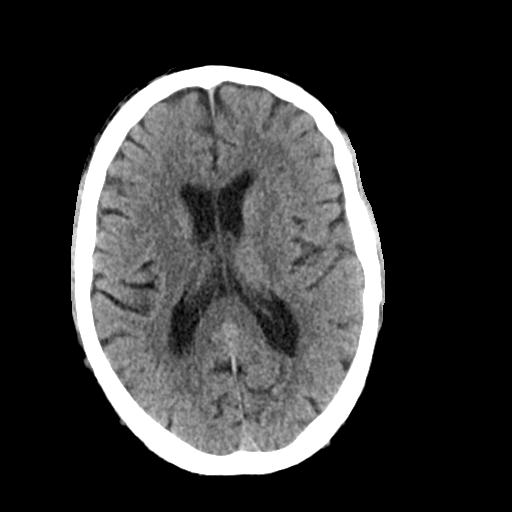
[im 18/35  bone]
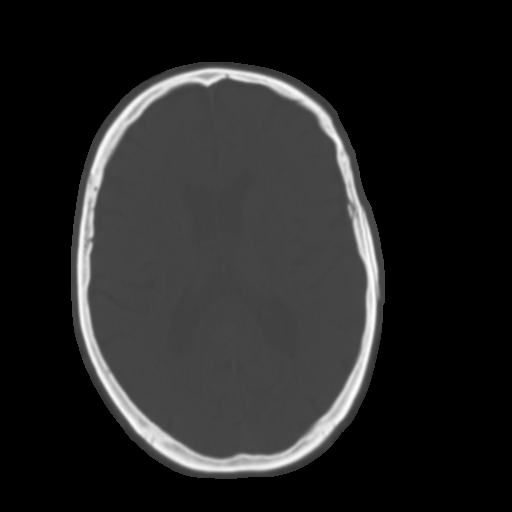
[im 22/35  brain]
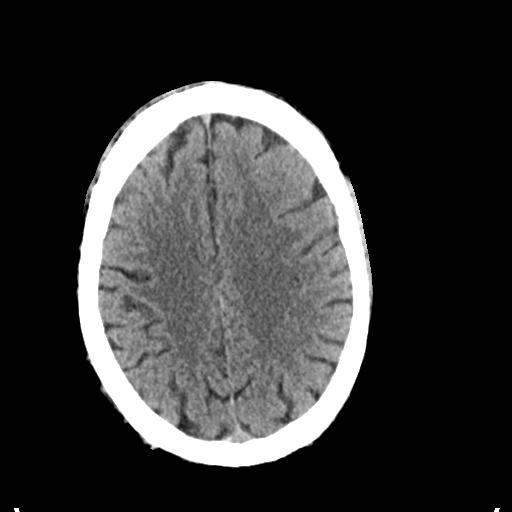
[im 25/35  brain]
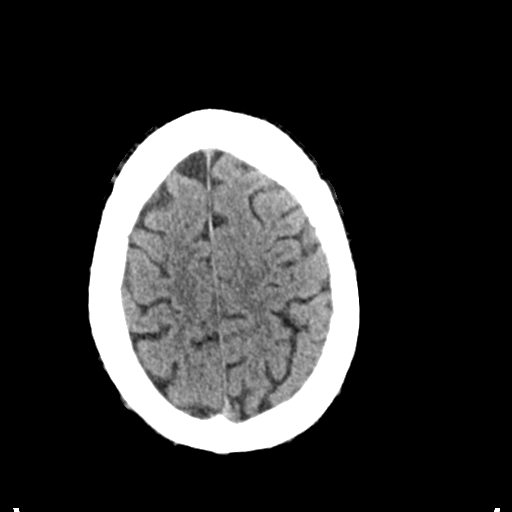
[im 29/35  brain]
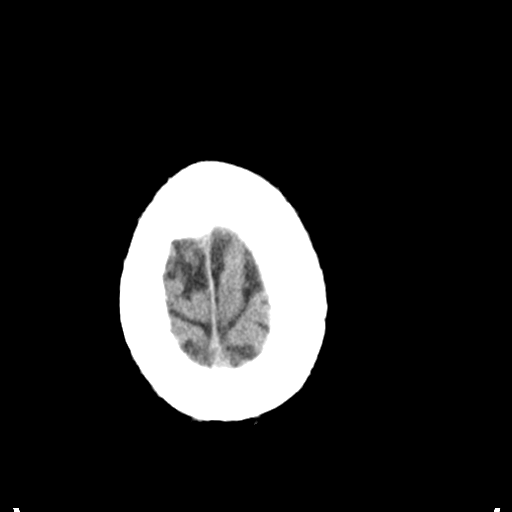
[im 32/35  brain]
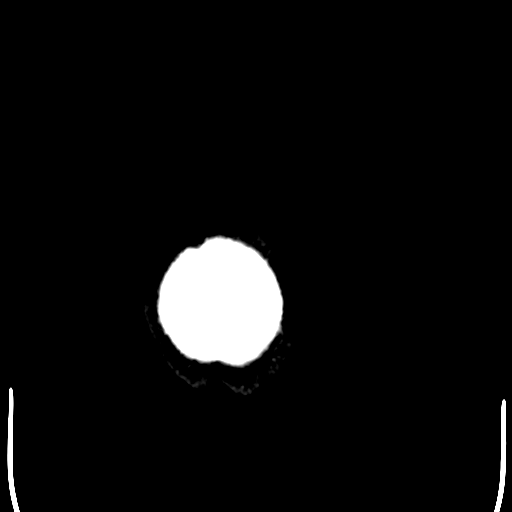
[im 32/35  bone]
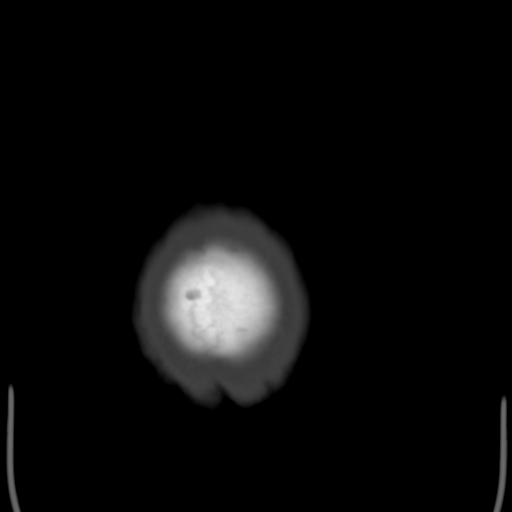

[Series 4: coronal soft tissue · coronal · 0.31mm/px · 3 of 70 slices shown]
[im 24/70  brain]
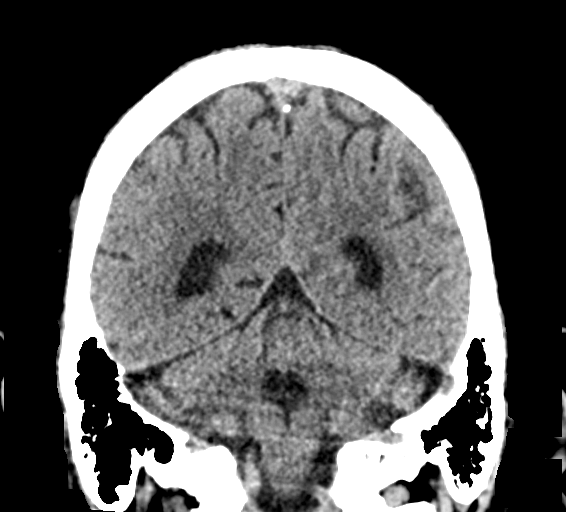
[im 31/70  brain]
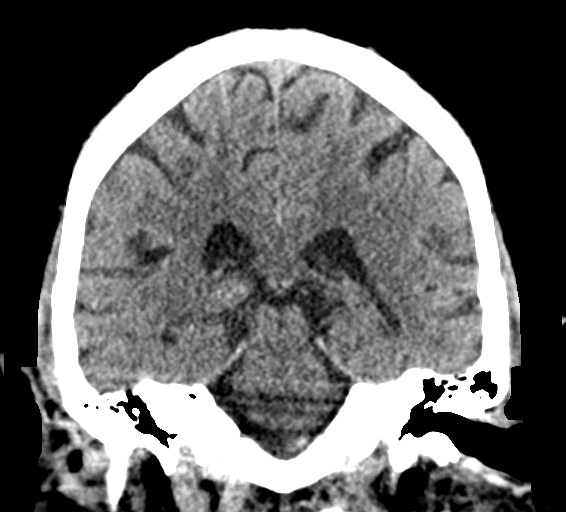
[im 39/70  brain]
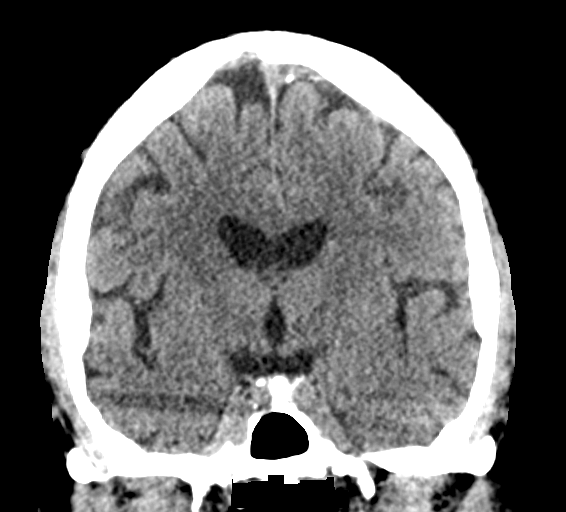

[Series 5: sagittal soft tissue · sagittal · 0.31mm/px · 3 of 59 slices shown]
[im 20/59  brain]
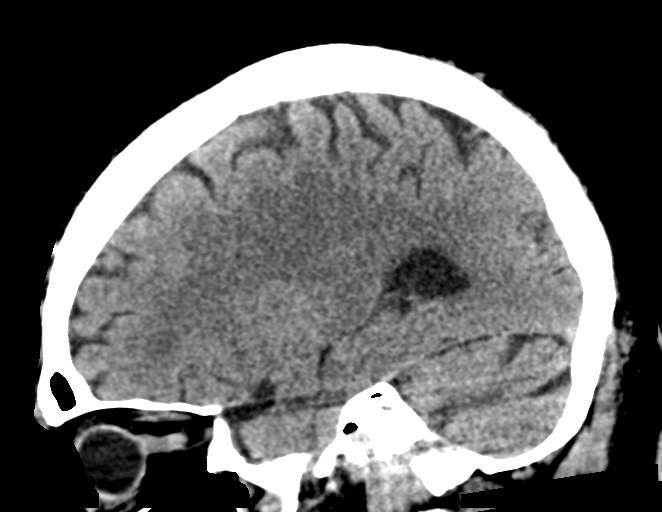
[im 30/59  brain]
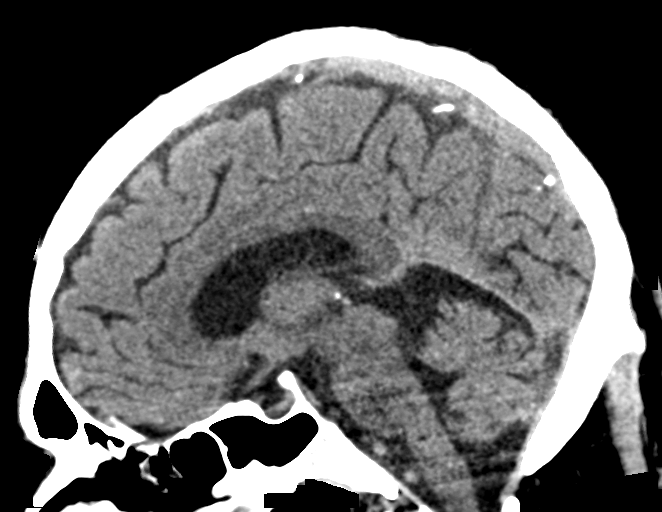
[im 39/59  brain]
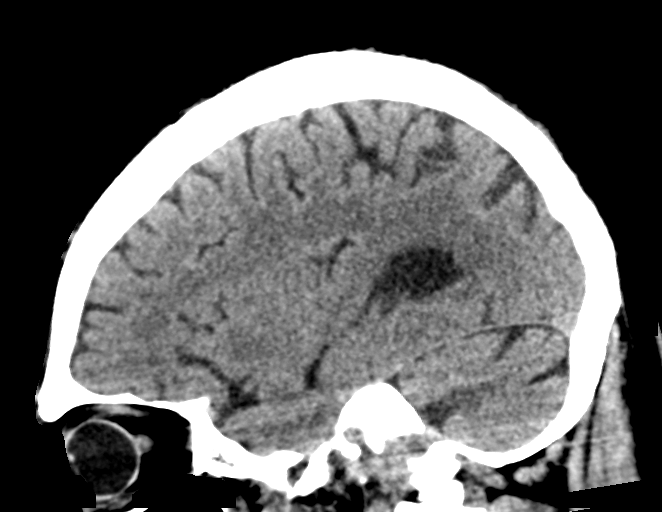

[15 of 47 positions shown; findings below may reference images not displayed]

FINDINGS: Brain: No evidence of acute infarction, hemorrhage, hydrocephalus,
extra-axial collection or mass lesion/mass effect. Mild age-related
cerebral atrophy with compensatory dilatation of the ventricles.

Vascular: No hyperdense vessel or unexpected calcification.

Skull: Normal. Negative for fracture or focal lesion.

Sinuses/Orbits: Scattered around paranasal sinus mucosal thickening.
Mastoid air cells are clear. Bilateral lens replacement.

Other: None.
IMPRESSION: No acute intracranial abnormality. Mild age related cerebral
atrophy.

## 2018-09-30 DIAGNOSIS — H9202 Otalgia, left ear: Secondary | ICD-10-CM | POA: Diagnosis not present

## 2019-03-15 DIAGNOSIS — H353132 Nonexudative age-related macular degeneration, bilateral, intermediate dry stage: Secondary | ICD-10-CM | POA: Diagnosis not present

## 2019-03-15 DIAGNOSIS — Z961 Presence of intraocular lens: Secondary | ICD-10-CM | POA: Diagnosis not present

## 2019-03-15 DIAGNOSIS — H40013 Open angle with borderline findings, low risk, bilateral: Secondary | ICD-10-CM | POA: Diagnosis not present

## 2019-05-03 DIAGNOSIS — E039 Hypothyroidism, unspecified: Secondary | ICD-10-CM | POA: Diagnosis not present

## 2019-05-03 DIAGNOSIS — E559 Vitamin D deficiency, unspecified: Secondary | ICD-10-CM | POA: Diagnosis not present

## 2019-05-03 DIAGNOSIS — E785 Hyperlipidemia, unspecified: Secondary | ICD-10-CM | POA: Diagnosis not present

## 2019-05-10 DIAGNOSIS — E039 Hypothyroidism, unspecified: Secondary | ICD-10-CM | POA: Diagnosis not present

## 2019-05-10 DIAGNOSIS — Z0000 Encounter for general adult medical examination without abnormal findings: Secondary | ICD-10-CM | POA: Diagnosis not present

## 2019-05-10 DIAGNOSIS — E559 Vitamin D deficiency, unspecified: Secondary | ICD-10-CM | POA: Diagnosis not present

## 2019-05-10 DIAGNOSIS — R9431 Abnormal electrocardiogram [ECG] [EKG]: Secondary | ICD-10-CM | POA: Diagnosis not present

## 2019-05-19 NOTE — Progress Notes (Signed)
Patient referred by Janie Morning, DO for abnormal ekg  Subjective:   Stephen Mcclain, male    DOB: Oct 23, 1930, 86 y.o.   MRN: [id number]   Chief Complaint  Patient presents with  . New Patient (Initial Visit)  . Abnormal ECG    HPI  86 y.o. Caucasian male with abnormal EKG.  Patient lives with his fiance, is extremely active and functional for his age. He denies chest pain, shortness of breath, palpitations, leg edema, orthopnea, PND, TIA/syncope. Has remote h/o syncope with sighting of blood, several years ago, none recently. He reports that he has had a murmur "all his life".  He attributes his good health to playing sports through college, as well as pro baseball.    Past Medical History:  Diagnosis Date  . Adenomatous polyps 03/2001  . Allergy   . Arthritis   . BPH (benign prostatic hyperplasia)   . Chronic insomnia   . Chronic rhinitis   . Duodenitis   . ED (erectile dysfunction)   . Esophageal stricture   . Heart murmur    in his teens  . Hemorrhoids   . Hyperlipidemia   . Nonspecific elevation of levels of transaminase or lactic acid dehydrogenase (LDH)   . Prostate cancer [facility])      Past Surgical History:  Procedure Laterality Date  . CATARACT EXTRACTION     lens implants  . COLONOSCOPY    . DERMOID CYST  EXCISION     arm  . HEMORRHOID SURGERY    . NASAL SINUS SURGERY     2013  . POLYPECTOMY    . RADIOACTIVE SEED IMPLANT  1999  . TONSILLECTOMY AND ADENOIDECTOMY    . TOTAL HIP ARTHROPLASTY Left 03/17/2017   Procedure: LEFT TOTAL HIP ARTHROPLASTY ANTERIOR APPROACH;  Surgeon: Gaynelle Arabian, MD;  Location: [facility];  Service: Orthopedics;  Laterality: Left;     Social History   Tobacco Use  Smoking Status Never Smoker  Smokeless Tobacco Never Used    Social History   Substance and Sexual Activity  Alcohol Use Yes  . Alcohol/week: 1.0 standard drinks  . Types: 1 Glasses of wine per week   Comment: 2-3 a week      Family History    Problem Relation Age of Onset  . Heart disease Father   . Colon polyps Mother        Brother, and Sister  . Colon cancer Neg Hx   . Esophageal cancer Neg Hx   . Stomach cancer Neg Hx   . Rectal cancer Neg Hx      Current Outpatient Medications on File Prior to Visit  Medication Sig Dispense Refill  . ASTAXANTHIN PO Take 12 mg by mouth daily.    . Cholecalciferol (VITAMIN D3) 1.25 MG (50000 UT) CAPS Take by mouth daily.    . Coenzyme Q10 (COQ10) 100 MG CAPS Take by mouth daily.    Marland Kitchen lactobacillus acidophilus (BACID) TABS tablet Take 2 tablets by mouth daily.    Marland Kitchen LORazepam (ATIVAN) 1 MG tablet Take 0.5-1 mg by mouth at bedtime.     . Lutein-Zeaxanthin (VITEYES ESSENTIALS VISION SUPP PO) Take by mouth. 4 daily    . Multiple Vitamin (MULTIVITAMIN ADULT PO) Take by mouth daily.    . Omega-3 Fatty Acids (OMEGA-3 CF PO) Take by mouth. 2 qd    . Sennosides 25 MG TABS Take 100 mg by mouth at bedtime.     No current facility-administered medications on file prior  to visit.    Cardiovascular and other pertinent studies:  EKG 05/22/2019: Sinus rhythm 64 bpm. Right bundle branch block.  Frequent ectopic ventricular beats   EKG: 05/10/2019  Recent labs: 05/03/2019: Glucose 90, BUN/Cr 16/0.89. EGFR 77. Na/K 140/3.9. Rest of the CMP normal H/H 15.0/45.2. MCV 95.0. Platelets 254 Chol 183, TG 95, HDL 49, LDL 117 TSH 3.05  normal   Review of Systems  Cardiovascular: Negative for chest pain, dyspnea on exertion, leg swelling, palpitations and syncope.         Vitals:   05/22/19 1458  BP: 123/67  Pulse: 63  Temp: 98.7 F (37.1 C)  SpO2: 95%     Body mass index is 27.02 kg/m. Filed Weights   05/22/19 1458  Weight: 204 lb 12.8 oz (92.9 kg)    Objective:   Physical Exam  Constitutional: He appears well-developed and well-nourished.  Neck: No JVD present.  Cardiovascular: Normal rate, regular rhythm and intact distal pulses.  Murmur heard.  Systolic murmur is present  with a grade of 2/6 at the upper right sternal border. Pulmonary/Chest: Effort normal and breath sounds normal. He has no wheezes. He has no rales.  Musculoskeletal:        General: No edema.  Nursing note and vitals reviewed.       Assessment & Recommendations:   86 y.o. Caucasian male with abnormal EKG.  Abnormal EKG: RBBB and frequent PVC's without any symptoms. Will obtain echocardiogram. I expect to see mild aortic stenosis. If EF is preserved, he does not need any treatment for his frequent PVC's.   Thank you for referring the patient to [place]. Please feel free to contact with any questions.  Nigel Mormon, MD [facility] Pager: [phone] Office: [phone]

## 2019-05-21 DIAGNOSIS — R9431 Abnormal electrocardiogram [ECG] [EKG]: Secondary | ICD-10-CM | POA: Insufficient documentation

## 2019-05-22 ENCOUNTER — Ambulatory Visit: Payer: Medicare Other | Admitting: Cardiology

## 2019-05-22 ENCOUNTER — Other Ambulatory Visit: Payer: Self-pay

## 2019-05-22 ENCOUNTER — Encounter: Payer: Self-pay | Admitting: Cardiology

## 2019-05-22 VITALS — BP 123/67 | HR 63 | Temp 98.7°F | Ht 73.0 in | Wt 204.8 lb

## 2019-05-22 DIAGNOSIS — R9431 Abnormal electrocardiogram [ECG] [EKG]: Secondary | ICD-10-CM

## 2019-05-22 DIAGNOSIS — I4510 Unspecified right bundle-branch block: Secondary | ICD-10-CM

## 2019-05-22 DIAGNOSIS — R011 Cardiac murmur, unspecified: Secondary | ICD-10-CM | POA: Insufficient documentation

## 2019-05-22 DIAGNOSIS — I493 Ventricular premature depolarization: Secondary | ICD-10-CM | POA: Insufficient documentation

## 2019-05-23 DIAGNOSIS — L309 Dermatitis, unspecified: Secondary | ICD-10-CM | POA: Diagnosis not present

## 2019-05-29 ENCOUNTER — Ambulatory Visit: Payer: Medicare Other

## 2019-05-29 ENCOUNTER — Other Ambulatory Visit: Payer: Self-pay

## 2019-05-29 DIAGNOSIS — R9431 Abnormal electrocardiogram [ECG] [EKG]: Secondary | ICD-10-CM

## 2019-06-13 NOTE — Progress Notes (Signed)
Called pt no answer, left a vm

## 2019-06-13 NOTE — Progress Notes (Signed)
Pt called back due to a miss call, inform him about his echo results

## 2019-09-06 ENCOUNTER — Ambulatory Visit (INDEPENDENT_AMBULATORY_CARE_PROVIDER_SITE_OTHER): Payer: Medicare Other

## 2019-09-06 ENCOUNTER — Other Ambulatory Visit: Payer: Self-pay

## 2019-09-06 ENCOUNTER — Encounter: Payer: Self-pay | Admitting: Podiatry

## 2019-09-06 ENCOUNTER — Ambulatory Visit: Payer: Medicare Other | Admitting: Podiatry

## 2019-09-06 VITALS — Temp 98.1°F

## 2019-09-06 DIAGNOSIS — M722 Plantar fascial fibromatosis: Secondary | ICD-10-CM

## 2019-09-06 NOTE — Patient Instructions (Signed)

## 2019-09-07 NOTE — Progress Notes (Signed)
Subjective:   Patient ID: Stephen Mcclain, male   DOB: 86 y.o.   MRN: [id number]   HPI Patient presents stating he has developed a lot of pain in the bottom of his left heel and he does not remember any injury.  States is been present for 6 weeks and is gradually gotten worse over that time does not currently smoke likes to be active   Review of Systems  All other systems reviewed and are negative.       Objective:  Physical Exam Vitals and nursing note reviewed.  Constitutional:      Appearance: He is well-developed.  Pulmonary:     Effort: Pulmonary effort is normal.  Musculoskeletal:        General: Normal range of motion.  Skin:    General: Skin is warm.  Neurological:     Mental Status: He is alert.     Neurovascular status intact muscle strength is found to be adequate range of motion within normal limits.  Patient is noted to have acute discomfort plantar aspect left heel at the insertional point of the tendon into the calcaneus with inflammation fluid of the medial band.  Patient is noted to have good digital perfusion is well oriented x3 with moderate depression of the arch noted     Assessment:  Significant acute plantar fasciitis left medial band at the insertion calcaneus     Plan:  H&P x-ray reviewed and today I did sterile prep and injected the fascial insertion 3 mg Kenalog 5 mg Xylocaine and instructed on supportive therapy and anti-inflammatories.  Patient also will begin physical therapy stretching shoe gear modifications and will be seen back as symptoms indicate  X-rays indicate small spur no indications of stress fracture or advanced arthritis

## 2020-03-20 DIAGNOSIS — H40013 Open angle with borderline findings, low risk, bilateral: Secondary | ICD-10-CM | POA: Diagnosis not present

## 2020-03-20 DIAGNOSIS — Z961 Presence of intraocular lens: Secondary | ICD-10-CM | POA: Diagnosis not present

## 2020-03-20 DIAGNOSIS — H353132 Nonexudative age-related macular degeneration, bilateral, intermediate dry stage: Secondary | ICD-10-CM | POA: Diagnosis not present

## 2020-03-27 DIAGNOSIS — D1722 Benign lipomatous neoplasm of skin and subcutaneous tissue of left arm: Secondary | ICD-10-CM | POA: Diagnosis not present

## 2020-03-27 DIAGNOSIS — L821 Other seborrheic keratosis: Secondary | ICD-10-CM | POA: Diagnosis not present

## 2020-03-27 DIAGNOSIS — L570 Actinic keratosis: Secondary | ICD-10-CM | POA: Diagnosis not present

## 2020-03-27 DIAGNOSIS — D1801 Hemangioma of skin and subcutaneous tissue: Secondary | ICD-10-CM | POA: Diagnosis not present

## 2020-03-27 DIAGNOSIS — D2371 Other benign neoplasm of skin of right lower limb, including hip: Secondary | ICD-10-CM | POA: Diagnosis not present

## 2020-05-07 DIAGNOSIS — E039 Hypothyroidism, unspecified: Secondary | ICD-10-CM | POA: Diagnosis not present

## 2020-05-07 DIAGNOSIS — E559 Vitamin D deficiency, unspecified: Secondary | ICD-10-CM | POA: Diagnosis not present

## 2020-05-07 DIAGNOSIS — E785 Hyperlipidemia, unspecified: Secondary | ICD-10-CM | POA: Diagnosis not present

## 2020-05-07 DIAGNOSIS — Z0000 Encounter for general adult medical examination without abnormal findings: Secondary | ICD-10-CM | POA: Diagnosis not present

## 2020-05-14 DIAGNOSIS — E559 Vitamin D deficiency, unspecified: Secondary | ICD-10-CM | POA: Diagnosis not present

## 2020-05-14 DIAGNOSIS — Z0000 Encounter for general adult medical examination without abnormal findings: Secondary | ICD-10-CM | POA: Diagnosis not present

## 2021-02-27 DIAGNOSIS — L111 Transient acantholytic dermatosis [Grover]: Secondary | ICD-10-CM | POA: Diagnosis not present

## 2021-02-27 DIAGNOSIS — L821 Other seborrheic keratosis: Secondary | ICD-10-CM | POA: Diagnosis not present

## 2021-02-27 DIAGNOSIS — L570 Actinic keratosis: Secondary | ICD-10-CM | POA: Diagnosis not present

## 2021-02-27 DIAGNOSIS — L738 Other specified follicular disorders: Secondary | ICD-10-CM | POA: Diagnosis not present

## 2021-02-27 DIAGNOSIS — D1801 Hemangioma of skin and subcutaneous tissue: Secondary | ICD-10-CM | POA: Diagnosis not present

## 2021-03-20 DIAGNOSIS — H353132 Nonexudative age-related macular degeneration, bilateral, intermediate dry stage: Secondary | ICD-10-CM | POA: Diagnosis not present

## 2021-03-20 DIAGNOSIS — H40013 Open angle with borderline findings, low risk, bilateral: Secondary | ICD-10-CM | POA: Diagnosis not present

## 2021-03-20 DIAGNOSIS — Z961 Presence of intraocular lens: Secondary | ICD-10-CM | POA: Diagnosis not present

## 2021-05-20 ENCOUNTER — Other Ambulatory Visit: Payer: Self-pay | Admitting: Registered Nurse

## 2021-05-20 DIAGNOSIS — E785 Hyperlipidemia, unspecified: Secondary | ICD-10-CM

## 2021-06-13 ENCOUNTER — Other Ambulatory Visit: Payer: Medicare Other

## 2021-11-13 DIAGNOSIS — D2371 Other benign neoplasm of skin of right lower limb, including hip: Secondary | ICD-10-CM | POA: Diagnosis not present

## 2021-11-13 DIAGNOSIS — L111 Transient acantholytic dermatosis [Grover]: Secondary | ICD-10-CM | POA: Diagnosis not present

## 2021-11-13 DIAGNOSIS — C44519 Basal cell carcinoma of skin of other part of trunk: Secondary | ICD-10-CM | POA: Diagnosis not present

## 2021-11-13 DIAGNOSIS — L821 Other seborrheic keratosis: Secondary | ICD-10-CM | POA: Diagnosis not present

## 2021-11-13 DIAGNOSIS — L438 Other lichen planus: Secondary | ICD-10-CM | POA: Diagnosis not present

## 2022-04-02 DIAGNOSIS — H40013 Open angle with borderline findings, low risk, bilateral: Secondary | ICD-10-CM | POA: Diagnosis not present

## 2022-04-02 DIAGNOSIS — H26493 Other secondary cataract, bilateral: Secondary | ICD-10-CM | POA: Diagnosis not present

## 2022-04-02 DIAGNOSIS — H353132 Nonexudative age-related macular degeneration, bilateral, intermediate dry stage: Secondary | ICD-10-CM | POA: Diagnosis not present

## 2022-04-02 DIAGNOSIS — Z961 Presence of intraocular lens: Secondary | ICD-10-CM | POA: Diagnosis not present

## 2022-05-19 DIAGNOSIS — E039 Hypothyroidism, unspecified: Secondary | ICD-10-CM | POA: Diagnosis not present

## 2022-05-19 DIAGNOSIS — Z0000 Encounter for general adult medical examination without abnormal findings: Secondary | ICD-10-CM | POA: Diagnosis not present

## 2022-05-19 DIAGNOSIS — N182 Chronic kidney disease, stage 2 (mild): Secondary | ICD-10-CM | POA: Diagnosis not present

## 2022-05-19 DIAGNOSIS — E559 Vitamin D deficiency, unspecified: Secondary | ICD-10-CM | POA: Diagnosis not present

## 2022-05-19 DIAGNOSIS — E785 Hyperlipidemia, unspecified: Secondary | ICD-10-CM | POA: Diagnosis not present

## 2022-05-26 ENCOUNTER — Other Ambulatory Visit (HOSPITAL_BASED_OUTPATIENT_CLINIC_OR_DEPARTMENT_OTHER): Payer: Self-pay | Admitting: Registered Nurse

## 2022-05-26 DIAGNOSIS — E785 Hyperlipidemia, unspecified: Secondary | ICD-10-CM

## 2022-05-26 DIAGNOSIS — Z0000 Encounter for general adult medical examination without abnormal findings: Secondary | ICD-10-CM | POA: Diagnosis not present

## 2022-05-26 DIAGNOSIS — E559 Vitamin D deficiency, unspecified: Secondary | ICD-10-CM | POA: Diagnosis not present

## 2022-06-30 ENCOUNTER — Ambulatory Visit (HOSPITAL_BASED_OUTPATIENT_CLINIC_OR_DEPARTMENT_OTHER)
Admission: RE | Admit: 2022-06-30 | Discharge: 2022-06-30 | Disposition: A | Discharge status: Home / Self Care | Payer: Medicare Other | Source: Ambulatory Visit | Attending: Registered Nurse | Admitting: Registered Nurse

## 2022-06-30 DIAGNOSIS — E785 Hyperlipidemia, unspecified: Secondary | ICD-10-CM | POA: Insufficient documentation

## 2022-11-16 DIAGNOSIS — C4441 Basal cell carcinoma of skin of scalp and neck: Secondary | ICD-10-CM | POA: Diagnosis not present

## 2022-11-16 DIAGNOSIS — D225 Melanocytic nevi of trunk: Secondary | ICD-10-CM | POA: Diagnosis not present

## 2022-11-16 DIAGNOSIS — Z85828 Personal history of other malignant neoplasm of skin: Secondary | ICD-10-CM | POA: Diagnosis not present

## 2022-11-16 DIAGNOSIS — L905 Scar conditions and fibrosis of skin: Secondary | ICD-10-CM | POA: Diagnosis not present

## 2022-11-16 DIAGNOSIS — D485 Neoplasm of uncertain behavior of skin: Secondary | ICD-10-CM | POA: Diagnosis not present

## 2022-11-16 DIAGNOSIS — D1801 Hemangioma of skin and subcutaneous tissue: Secondary | ICD-10-CM | POA: Diagnosis not present

## 2022-12-23 DIAGNOSIS — H26493 Other secondary cataract, bilateral: Secondary | ICD-10-CM | POA: Diagnosis not present

## 2022-12-23 DIAGNOSIS — H353132 Nonexudative age-related macular degeneration, bilateral, intermediate dry stage: Secondary | ICD-10-CM | POA: Diagnosis not present

## 2022-12-23 DIAGNOSIS — H40013 Open angle with borderline findings, low risk, bilateral: Secondary | ICD-10-CM | POA: Diagnosis not present

## 2022-12-23 DIAGNOSIS — Z961 Presence of intraocular lens: Secondary | ICD-10-CM | POA: Diagnosis not present

## 2023-03-04 DIAGNOSIS — K5900 Constipation, unspecified: Secondary | ICD-10-CM | POA: Diagnosis not present

## 2023-03-04 DIAGNOSIS — S39011A Strain of muscle, fascia and tendon of abdomen, initial encounter: Secondary | ICD-10-CM | POA: Diagnosis not present

## 2023-05-04 DIAGNOSIS — H9202 Otalgia, left ear: Secondary | ICD-10-CM | POA: Diagnosis not present

## 2023-05-26 DIAGNOSIS — Z0000 Encounter for general adult medical examination without abnormal findings: Secondary | ICD-10-CM | POA: Diagnosis not present

## 2023-05-26 DIAGNOSIS — E559 Vitamin D deficiency, unspecified: Secondary | ICD-10-CM | POA: Diagnosis not present

## 2023-05-26 DIAGNOSIS — E039 Hypothyroidism, unspecified: Secondary | ICD-10-CM | POA: Diagnosis not present

## 2023-05-26 DIAGNOSIS — E785 Hyperlipidemia, unspecified: Secondary | ICD-10-CM | POA: Diagnosis not present

## 2023-05-26 DIAGNOSIS — N182 Chronic kidney disease, stage 2 (mild): Secondary | ICD-10-CM | POA: Diagnosis not present

## 2023-06-02 DIAGNOSIS — E559 Vitamin D deficiency, unspecified: Secondary | ICD-10-CM | POA: Diagnosis not present

## 2023-06-02 DIAGNOSIS — Z0000 Encounter for general adult medical examination without abnormal findings: Secondary | ICD-10-CM | POA: Diagnosis not present

## 2023-06-24 DIAGNOSIS — R5383 Other fatigue: Secondary | ICD-10-CM | POA: Diagnosis not present

## 2023-06-24 DIAGNOSIS — H9201 Otalgia, right ear: Secondary | ICD-10-CM | POA: Diagnosis not present

## 2023-11-16 DIAGNOSIS — Z85828 Personal history of other malignant neoplasm of skin: Secondary | ICD-10-CM | POA: Diagnosis not present

## 2023-11-16 DIAGNOSIS — L570 Actinic keratosis: Secondary | ICD-10-CM | POA: Diagnosis not present

## 2023-11-16 DIAGNOSIS — D2372 Other benign neoplasm of skin of left lower limb, including hip: Secondary | ICD-10-CM | POA: Diagnosis not present

## 2023-11-16 DIAGNOSIS — D2371 Other benign neoplasm of skin of right lower limb, including hip: Secondary | ICD-10-CM | POA: Diagnosis not present

## 2023-11-16 DIAGNOSIS — L565 Disseminated superficial actinic porokeratosis (DSAP): Secondary | ICD-10-CM | POA: Diagnosis not present

## 2023-11-16 DIAGNOSIS — L438 Other lichen planus: Secondary | ICD-10-CM | POA: Diagnosis not present

## 2023-11-16 DIAGNOSIS — L111 Transient acantholytic dermatosis [Grover]: Secondary | ICD-10-CM | POA: Diagnosis not present

## 2023-12-22 DIAGNOSIS — H26493 Other secondary cataract, bilateral: Secondary | ICD-10-CM | POA: Diagnosis not present

## 2023-12-22 DIAGNOSIS — H40013 Open angle with borderline findings, low risk, bilateral: Secondary | ICD-10-CM | POA: Diagnosis not present

## 2023-12-22 DIAGNOSIS — Z961 Presence of intraocular lens: Secondary | ICD-10-CM | POA: Diagnosis not present

## 2023-12-22 DIAGNOSIS — H353132 Nonexudative age-related macular degeneration, bilateral, intermediate dry stage: Secondary | ICD-10-CM | POA: Diagnosis not present
